# Patient Record
Sex: Female | Born: 1980 | Race: Black or African American | Hispanic: No | Marital: Single | Smoking: Never smoker
Health system: Southern US, Community
[De-identification: ages and names within clinical notes are randomized; demographics above are authoritative.]

## PROBLEM LIST (undated history)

## (undated) DIAGNOSIS — I10 Essential (primary) hypertension: Secondary | ICD-10-CM

---

## 2003-09-05 ENCOUNTER — Emergency Department (HOSPITAL_COMMUNITY): Admission: EM | Admit: 2003-09-05 | Discharge: 2003-09-05 | Payer: Self-pay | Admitting: *Deleted

## 2004-05-08 ENCOUNTER — Ambulatory Visit: Payer: Self-pay | Admitting: Nurse Practitioner

## 2004-07-14 ENCOUNTER — Ambulatory Visit: Payer: Self-pay | Admitting: Family Medicine

## 2004-08-09 ENCOUNTER — Ambulatory Visit: Payer: Self-pay | Admitting: Nurse Practitioner

## 2004-08-10 ENCOUNTER — Ambulatory Visit: Payer: Self-pay | Admitting: Nurse Practitioner

## 2004-11-21 ENCOUNTER — Ambulatory Visit: Payer: Self-pay | Admitting: Nurse Practitioner

## 2004-12-13 ENCOUNTER — Ambulatory Visit: Payer: Self-pay | Admitting: Nurse Practitioner

## 2004-12-14 ENCOUNTER — Ambulatory Visit: Payer: Self-pay | Admitting: Nurse Practitioner

## 2005-02-01 ENCOUNTER — Ambulatory Visit: Payer: Self-pay | Admitting: Nurse Practitioner

## 2005-09-18 ENCOUNTER — Emergency Department (HOSPITAL_COMMUNITY): Admission: EM | Admit: 2005-09-18 | Discharge: 2005-09-18 | Payer: Self-pay | Admitting: Emergency Medicine

## 2006-05-12 ENCOUNTER — Emergency Department (HOSPITAL_COMMUNITY): Admission: EM | Admit: 2006-05-12 | Discharge: 2006-05-12 | Payer: Self-pay | Admitting: Emergency Medicine

## 2006-08-24 ENCOUNTER — Emergency Department (HOSPITAL_COMMUNITY): Admission: EM | Admit: 2006-08-24 | Discharge: 2006-08-24 | Payer: Self-pay | Admitting: Emergency Medicine

## 2007-03-05 ENCOUNTER — Emergency Department (HOSPITAL_COMMUNITY): Admission: EM | Admit: 2007-03-05 | Discharge: 2007-03-05 | Payer: Self-pay | Admitting: Emergency Medicine

## 2007-05-19 ENCOUNTER — Emergency Department (HOSPITAL_COMMUNITY): Admission: EM | Admit: 2007-05-19 | Discharge: 2007-05-19 | Payer: Self-pay | Admitting: *Deleted

## 2007-06-24 ENCOUNTER — Inpatient Hospital Stay (HOSPITAL_COMMUNITY): Admission: AD | Admit: 2007-06-24 | Discharge: 2007-06-24 | Payer: Self-pay | Admitting: Obstetrics & Gynecology

## 2007-08-06 ENCOUNTER — Emergency Department (HOSPITAL_COMMUNITY): Admission: EM | Admit: 2007-08-06 | Discharge: 2007-08-06 | Payer: Self-pay | Admitting: Emergency Medicine

## 2008-03-05 ENCOUNTER — Inpatient Hospital Stay (HOSPITAL_COMMUNITY): Admission: AD | Admit: 2008-03-05 | Discharge: 2008-03-05 | Payer: Self-pay | Admitting: Obstetrics & Gynecology

## 2008-08-21 ENCOUNTER — Inpatient Hospital Stay (HOSPITAL_COMMUNITY): Admission: AD | Admit: 2008-08-21 | Discharge: 2008-08-21 | Payer: Self-pay | Admitting: Gynecology

## 2008-10-07 ENCOUNTER — Inpatient Hospital Stay (HOSPITAL_COMMUNITY): Admission: AD | Admit: 2008-10-07 | Discharge: 2008-10-07 | Payer: Self-pay | Admitting: Obstetrics and Gynecology

## 2008-11-01 ENCOUNTER — Inpatient Hospital Stay (HOSPITAL_COMMUNITY): Admission: AD | Admit: 2008-11-01 | Discharge: 2008-11-01 | Payer: Self-pay | Admitting: Obstetrics and Gynecology

## 2008-12-03 ENCOUNTER — Inpatient Hospital Stay (HOSPITAL_COMMUNITY): Admission: AD | Admit: 2008-12-03 | Discharge: 2008-12-04 | Payer: Self-pay | Admitting: Obstetrics and Gynecology

## 2009-03-08 ENCOUNTER — Inpatient Hospital Stay (HOSPITAL_COMMUNITY): Admission: AD | Admit: 2009-03-08 | Discharge: 2009-03-08 | Payer: Self-pay | Admitting: Obstetrics and Gynecology

## 2009-04-02 ENCOUNTER — Inpatient Hospital Stay (HOSPITAL_COMMUNITY): Admission: AD | Admit: 2009-04-02 | Discharge: 2009-04-05 | Payer: Self-pay | Admitting: Obstetrics and Gynecology

## 2010-02-12 ENCOUNTER — Inpatient Hospital Stay (HOSPITAL_COMMUNITY): Admission: AD | Admit: 2010-02-12 | Discharge: 2010-02-12 | Payer: Self-pay | Admitting: Obstetrics and Gynecology

## 2010-02-12 ENCOUNTER — Ambulatory Visit: Payer: Self-pay | Admitting: Nurse Practitioner

## 2010-04-05 ENCOUNTER — Ambulatory Visit: Payer: Self-pay | Admitting: Gynecology

## 2010-04-05 ENCOUNTER — Inpatient Hospital Stay (HOSPITAL_COMMUNITY): Admission: AD | Admit: 2010-04-05 | Discharge: 2010-04-05 | Payer: Self-pay | Admitting: Obstetrics and Gynecology

## 2010-07-15 ENCOUNTER — Inpatient Hospital Stay (HOSPITAL_COMMUNITY)
Admission: AD | Admit: 2010-07-15 | Discharge: 2010-07-15 | Payer: Self-pay | Source: Home / Self Care | Attending: Obstetrics and Gynecology | Admitting: Obstetrics and Gynecology

## 2010-08-23 ENCOUNTER — Inpatient Hospital Stay (HOSPITAL_COMMUNITY)
Admission: AD | Admit: 2010-08-23 | Discharge: 2010-08-23 | Disposition: A | Discharge status: Home / Self Care | Payer: Medicaid Other | Source: Ambulatory Visit | Attending: Obstetrics and Gynecology | Admitting: Obstetrics and Gynecology

## 2010-08-23 DIAGNOSIS — O4700 False labor before 37 completed weeks of gestation, unspecified trimester: Secondary | ICD-10-CM | POA: Insufficient documentation

## 2010-09-12 ENCOUNTER — Inpatient Hospital Stay (HOSPITAL_COMMUNITY)
Admission: AD | Admit: 2010-09-12 | Discharge: 2010-09-12 | Disposition: A | Discharge status: Home / Self Care | Payer: Medicaid Other | Source: Ambulatory Visit | Attending: Obstetrics and Gynecology | Admitting: Obstetrics and Gynecology

## 2010-09-12 DIAGNOSIS — O479 False labor, unspecified: Secondary | ICD-10-CM | POA: Insufficient documentation

## 2010-09-21 ENCOUNTER — Inpatient Hospital Stay (HOSPITAL_COMMUNITY)
Admission: RE | Admit: 2010-09-21 | Payer: Medicaid Other | Source: Ambulatory Visit | Admitting: Obstetrics and Gynecology

## 2010-09-21 ENCOUNTER — Inpatient Hospital Stay (HOSPITAL_COMMUNITY)
Admission: RE | Admit: 2010-09-21 | Discharge: 2010-09-23 | DRG: 775 | Disposition: A | Discharge status: Home / Self Care | Payer: Medicaid Other | Source: Ambulatory Visit | Attending: Obstetrics and Gynecology | Admitting: Obstetrics and Gynecology

## 2010-09-21 LAB — CBC
MCH: 29.8 pg (ref 26.0–34.0)
Platelets: 265 10*3/uL (ref 150–400)
RBC: 3.92 MIL/uL (ref 3.87–5.11)

## 2010-09-21 LAB — RPR: RPR Ser Ql: NONREACTIVE

## 2010-09-22 ENCOUNTER — Inpatient Hospital Stay (HOSPITAL_COMMUNITY): Admission: AD | Admit: 2010-09-22 | Payer: Self-pay | Source: Home / Self Care | Admitting: Obstetrics and Gynecology

## 2010-09-22 LAB — CBC
Hemoglobin: 10.6 g/dL — ABNORMAL LOW (ref 12.0–15.0)
MCHC: 33.9 g/dL (ref 30.0–36.0)
Platelets: 225 10*3/uL (ref 150–400)
RDW: 13.2 % (ref 11.5–15.5)

## 2010-09-25 LAB — URINALYSIS, ROUTINE W REFLEX MICROSCOPIC
Glucose, UA: NEGATIVE mg/dL
Nitrite: NEGATIVE
Protein, ur: NEGATIVE mg/dL
Urobilinogen, UA: 0.2 mg/dL (ref 0.0–1.0)

## 2010-09-25 LAB — KLEIHAUER-BETKE STAIN: Fetal Cells %: 0 %

## 2010-09-28 LAB — COMPREHENSIVE METABOLIC PANEL
ALT: 10 U/L (ref 0–35)
Albumin: 3.2 g/dL — ABNORMAL LOW (ref 3.5–5.2)
Alkaline Phosphatase: 40 U/L (ref 39–117)
BUN: 4 mg/dL — ABNORMAL LOW (ref 6–23)
Chloride: 106 mEq/L (ref 96–112)
Glucose, Bld: 81 mg/dL (ref 70–99)
Potassium: 3.6 mEq/L (ref 3.5–5.1)
Sodium: 135 mEq/L (ref 135–145)
Total Bilirubin: 0.4 mg/dL (ref 0.3–1.2)

## 2010-09-28 LAB — CBC
HCT: 35.7 % — ABNORMAL LOW (ref 36.0–46.0)
MCV: 89.8 fL (ref 78.0–100.0)
RBC: 3.98 MIL/uL (ref 3.87–5.11)
WBC: 6.9 10*3/uL (ref 4.0–10.5)

## 2010-09-28 LAB — URINALYSIS, ROUTINE W REFLEX MICROSCOPIC
Bilirubin Urine: NEGATIVE
Ketones, ur: NEGATIVE mg/dL
Protein, ur: NEGATIVE mg/dL
Urobilinogen, UA: 0.2 mg/dL (ref 0.0–1.0)

## 2010-09-28 LAB — URINE MICROSCOPIC-ADD ON

## 2010-09-30 LAB — URINALYSIS, ROUTINE W REFLEX MICROSCOPIC
Glucose, UA: NEGATIVE mg/dL
Ketones, ur: NEGATIVE mg/dL
Protein, ur: NEGATIVE mg/dL
Urobilinogen, UA: 0.2 mg/dL (ref 0.0–1.0)

## 2010-09-30 LAB — CBC
HCT: 39.7 % (ref 36.0–46.0)
Hemoglobin: 13.6 g/dL (ref 12.0–15.0)
MCHC: 34.3 g/dL (ref 30.0–36.0)
RBC: 4.43 MIL/uL (ref 3.87–5.11)
WBC: 7.7 10*3/uL (ref 4.0–10.5)

## 2010-09-30 LAB — URINE CULTURE: Colony Count: 65000

## 2010-09-30 LAB — WET PREP, GENITAL
Trich, Wet Prep: NONE SEEN
Yeast Wet Prep HPF POC: NONE SEEN

## 2010-09-30 LAB — GC/CHLAMYDIA PROBE AMP, GENITAL
Chlamydia, DNA Probe: NEGATIVE
GC Probe Amp, Genital: NEGATIVE

## 2010-09-30 LAB — URINE MICROSCOPIC-ADD ON

## 2010-09-30 LAB — POCT PREGNANCY, URINE: Preg Test, Ur: POSITIVE

## 2010-09-30 LAB — HCG, QUANTITATIVE, PREGNANCY: hCG, Beta Chain, Quant, S: 142000 m[IU]/mL — ABNORMAL HIGH (ref <5)

## 2010-10-20 LAB — CBC
HCT: 31.2 % — ABNORMAL LOW (ref 36.0–46.0)
Hemoglobin: 10.8 g/dL — ABNORMAL LOW (ref 12.0–15.0)
Hemoglobin: 12.4 g/dL (ref 12.0–15.0)
Platelets: 222 10*3/uL (ref 150–400)
RBC: 3.34 MIL/uL — ABNORMAL LOW (ref 3.87–5.11)
RBC: 3.94 MIL/uL (ref 3.87–5.11)
WBC: 14.1 10*3/uL — ABNORMAL HIGH (ref 4.0–10.5)
WBC: 7.5 10*3/uL (ref 4.0–10.5)

## 2010-10-20 LAB — PLATELET COUNT: Platelets: 242 10*3/uL (ref 150–400)

## 2010-10-21 LAB — URINALYSIS, ROUTINE W REFLEX MICROSCOPIC
Bilirubin Urine: NEGATIVE
Glucose, UA: NEGATIVE mg/dL
Hgb urine dipstick: NEGATIVE
Nitrite: NEGATIVE
Specific Gravity, Urine: 1.02 (ref 1.005–1.030)
pH: 6 (ref 5.0–8.0)

## 2010-10-24 LAB — WET PREP, GENITAL: Trich, Wet Prep: NONE SEEN

## 2010-10-24 LAB — GC/CHLAMYDIA PROBE AMP, GENITAL: Chlamydia, DNA Probe: NEGATIVE

## 2010-10-25 LAB — URINALYSIS, ROUTINE W REFLEX MICROSCOPIC
Bilirubin Urine: NEGATIVE
Ketones, ur: NEGATIVE mg/dL
Nitrite: NEGATIVE
pH: 6 (ref 5.0–8.0)

## 2010-10-26 LAB — WET PREP, GENITAL: Yeast Wet Prep HPF POC: NONE SEEN

## 2010-10-31 LAB — CBC
Hemoglobin: 13.7 g/dL (ref 12.0–15.0)
MCHC: 33.9 g/dL (ref 30.0–36.0)
MCV: 87.9 fL (ref 78.0–100.0)
RBC: 4.6 MIL/uL (ref 3.87–5.11)

## 2010-10-31 LAB — URINALYSIS, ROUTINE W REFLEX MICROSCOPIC
Bilirubin Urine: NEGATIVE
Ketones, ur: NEGATIVE mg/dL
Nitrite: NEGATIVE
Protein, ur: NEGATIVE mg/dL
Urobilinogen, UA: 0.2 mg/dL (ref 0.0–1.0)

## 2010-10-31 LAB — POCT PREGNANCY, URINE: Preg Test, Ur: POSITIVE

## 2010-12-02 ENCOUNTER — Emergency Department (HOSPITAL_COMMUNITY)
Admission: EM | Admit: 2010-12-02 | Discharge: 2010-12-02 | Disposition: A | Discharge status: Home / Self Care | Payer: Medicaid Other | Attending: Emergency Medicine | Admitting: Emergency Medicine

## 2010-12-02 DIAGNOSIS — IMO0002 Reserved for concepts with insufficient information to code with codable children: Secondary | ICD-10-CM | POA: Insufficient documentation

## 2010-12-02 DIAGNOSIS — L299 Pruritus, unspecified: Secondary | ICD-10-CM | POA: Insufficient documentation

## 2010-12-02 DIAGNOSIS — N61 Mastitis without abscess: Secondary | ICD-10-CM | POA: Insufficient documentation

## 2011-04-06 LAB — GC/CHLAMYDIA PROBE AMP, GENITAL
Chlamydia, DNA Probe: NEGATIVE
GC Probe Amp, Genital: NEGATIVE

## 2011-04-06 LAB — WET PREP, GENITAL
Trich, Wet Prep: NONE SEEN
Yeast Wet Prep HPF POC: NONE SEEN

## 2011-04-06 LAB — POCT PREGNANCY, URINE
Operator id: 198171
Preg Test, Ur: NEGATIVE

## 2011-04-23 LAB — POCT PREGNANCY, URINE: Preg Test, Ur: NEGATIVE

## 2011-04-24 LAB — URINE MICROSCOPIC-ADD ON

## 2011-04-24 LAB — URINALYSIS, ROUTINE W REFLEX MICROSCOPIC
Glucose, UA: NEGATIVE
Leukocytes, UA: NEGATIVE
Nitrite: NEGATIVE
Protein, ur: NEGATIVE
pH: 5.5

## 2011-04-24 LAB — PREGNANCY, URINE: Preg Test, Ur: NEGATIVE

## 2011-04-27 LAB — URINALYSIS, ROUTINE W REFLEX MICROSCOPIC
Ketones, ur: NEGATIVE
Protein, ur: NEGATIVE
Urobilinogen, UA: 0.2

## 2011-04-27 LAB — URINE MICROSCOPIC-ADD ON

## 2011-04-27 LAB — BASIC METABOLIC PANEL
CO2: 25
Calcium: 9.6
Glucose, Bld: 86
Sodium: 139

## 2012-02-19 ENCOUNTER — Encounter (HOSPITAL_COMMUNITY): Payer: Self-pay | Admitting: Emergency Medicine

## 2012-02-19 ENCOUNTER — Emergency Department (HOSPITAL_COMMUNITY)
Admission: EM | Admit: 2012-02-19 | Discharge: 2012-02-19 | Disposition: A | Discharge status: Home / Self Care | Payer: Medicaid Other | Attending: Emergency Medicine | Admitting: Emergency Medicine

## 2012-02-19 DIAGNOSIS — S335XXA Sprain of ligaments of lumbar spine, initial encounter: Secondary | ICD-10-CM | POA: Insufficient documentation

## 2012-02-19 DIAGNOSIS — X500XXA Overexertion from strenuous movement or load, initial encounter: Secondary | ICD-10-CM | POA: Insufficient documentation

## 2012-02-19 DIAGNOSIS — S39012A Strain of muscle, fascia and tendon of lower back, initial encounter: Secondary | ICD-10-CM

## 2012-02-19 LAB — URINALYSIS, ROUTINE W REFLEX MICROSCOPIC
Bilirubin Urine: NEGATIVE
Specific Gravity, Urine: 1.017 (ref 1.005–1.030)
Urobilinogen, UA: 0.2 mg/dL (ref 0.0–1.0)

## 2012-02-19 LAB — URINE MICROSCOPIC-ADD ON

## 2012-02-19 LAB — PREGNANCY, URINE: Preg Test, Ur: NEGATIVE

## 2012-02-19 MED ORDER — DIAZEPAM 5 MG PO TABS: 5.0000 mg | ORAL_TABLET | Freq: Three times a day (TID) | ORAL | Status: AC | PRN

## 2012-02-19 MED ORDER — IBUPROFEN 800 MG PO TABS
800.0000 mg | ORAL_TABLET | Freq: Once | ORAL | Status: AC
  Administered 2012-02-19: 800 mg via ORAL
  Filled 2012-02-19: qty 1

## 2012-02-19 MED ORDER — NAPROXEN 375 MG PO TABS: 375.0000 mg | ORAL_TABLET | Freq: Two times a day (BID) | ORAL | Status: DC

## 2012-02-19 NOTE — ED Notes (Signed)
Pt reports 3 day hx of low back pain. tx with hot showers. Denies nausea or fever

## 2012-02-19 NOTE — ED Provider Notes (Signed)
History     CSN: [id number]  Arrival date & time 02/19/12  1326   First MD Initiated Contact with Patient 02/19/12 1445      Chief Complaint  Patient presents with  . low back pain      denies injury or dysuria. 3 day hx of low back pain. did not medicate    (Consider location/radiation/quality/duration/timing/severity/associated sxs/prior treatment) HPI Comments: Ashley Li is a 31 y.o. Female who presents with complaint of lower back pain. Pain started when she woke up 2 days ago. Pain worsened with movement, leaning forward, certain positions. No pain radiating down her legs. No bowel or urinary incontinence or retention. No abdominal pain. Pt does have two little kids that she has been lifting. No other injuries. No fever, nausea, vomiting, dysuria. No prior back problems.     History reviewed. No pertinent past medical history.  History reviewed. No pertinent past surgical history.  History reviewed. No pertinent family history.  History  Substance Use Topics  . Smoking status: Never Smoker   . Smokeless tobacco: Not on file  . Alcohol Use: No    OB History    Grav Para Term Preterm Abortions TAB SAB Ect Mult Living                  Review of Systems  Constitutional: Negative for fever and chills.  Respiratory: Negative.   Cardiovascular: Negative.   Gastrointestinal: Negative.   Genitourinary: Positive for vaginal bleeding. Negative for dysuria, hematuria, flank pain, menstrual problem and pelvic pain.  Musculoskeletal: Positive for back pain. Negative for gait problem.  Skin: Negative.   Neurological: Negative for weakness and numbness.    Allergies  Review of patient's allergies indicates no known allergies.  Home Medications   Current Outpatient Rx  Name Route Sig Dispense Refill  . BC HEADACHE POWDER PO Oral Take 1 packet by mouth every 6 (six) hours as needed. For pain      BP 113/70  Pulse 86  Temp 98.1 F (36.7 C) (Oral)  Resp 18   SpO2 100%  LMP 02/19/2012  Physical Exam  Nursing note and vitals reviewed. Constitutional: She is oriented to person, place, and time. She appears well-developed and well-nourished. No distress.  HENT:  Head: Normocephalic.  Eyes: Conjunctivae are normal.  Cardiovascular: Normal rate and normal heart sounds.   Pulmonary/Chest: Effort normal and breath sounds normal. No respiratory distress. She has no wheezes. She has no rales.  Abdominal: Soft. Bowel sounds are normal. She exhibits no distension. There is no tenderness. There is no rebound.       No CVA tenderness  Musculoskeletal:       No midline back tenderness. Bilateral lower paraspinal tenderness. No pain with straight leg raise.   Neurological: She is alert and oriented to person, place, and time. No cranial nerve deficit. Coordination normal.       2+ patellar reflexes bilaterally,  equal bilateral LE strength  Skin: Skin is warm and dry.  Psychiatric: She has a normal mood and affect.    ED Course  Procedures (including critical care time)  Pt with lower back pain, my suspicion is that this is musculoskeletal. Her urine is positive for RBCs and hgb, she is on her period. No signs of a kidney stone based on story and exam. She has no neuro deficits. No red flags to suggest cauda equana. Will try muscle relaxants, NSAIDs, follow up as needed.   Results for orders placed during  the hospital encounter of 02/19/12  URINALYSIS, ROUTINE W REFLEX MICROSCOPIC      Component Value Range   Color, Urine YELLOW  YELLOW   APPearance CLOUDY (*) CLEAR   Specific Gravity, Urine 1.017  1.005 - 1.030   pH 6.0  5.0 - 8.0   Glucose, UA NEGATIVE  NEGATIVE mg/dL   Hgb urine dipstick LARGE (*) NEGATIVE   Bilirubin Urine NEGATIVE  NEGATIVE   Ketones, ur NEGATIVE  NEGATIVE mg/dL   Protein, ur NEGATIVE  NEGATIVE mg/dL   Urobilinogen, UA 0.2  0.0 - 1.0 mg/dL   Nitrite NEGATIVE  NEGATIVE   Leukocytes, UA NEGATIVE  NEGATIVE  URINE  MICROSCOPIC-ADD ON      Component Value Range   Squamous Epithelial / LPF FEW (*) RARE   WBC, UA 0-2  <3 WBC/hpf   RBC / HPF 21-50  <3 RBC/hpf  PREGNANCY, URINE      Component Value Range   Preg Test, Ur NEGATIVE  NEGATIVE   No results found.    1. Lumbar strain       MDM          Lottie Mussel, PA 02/21/12 [phone]

## 2012-02-21 NOTE — ED Provider Notes (Signed)
Medical screening examination/treatment/procedure(s) were performed by non-physician practitioner and as supervising physician I was immediately available for consultation/collaboration.  Toy Baker, MD 02/21/12 [phone]

## 2012-07-15 ENCOUNTER — Emergency Department (HOSPITAL_COMMUNITY)
Admission: EM | Admit: 2012-07-15 | Discharge: 2012-07-15 | Disposition: A | Discharge status: Home / Self Care | Payer: Self-pay | Attending: Emergency Medicine | Admitting: Emergency Medicine

## 2012-07-15 ENCOUNTER — Encounter (HOSPITAL_COMMUNITY): Payer: Self-pay | Admitting: *Deleted

## 2012-07-15 DIAGNOSIS — R112 Nausea with vomiting, unspecified: Secondary | ICD-10-CM | POA: Insufficient documentation

## 2012-07-15 DIAGNOSIS — K2970 Gastritis, unspecified, without bleeding: Secondary | ICD-10-CM

## 2012-07-15 DIAGNOSIS — A088 Other specified intestinal infections: Secondary | ICD-10-CM | POA: Insufficient documentation

## 2012-07-15 LAB — URINALYSIS, ROUTINE W REFLEX MICROSCOPIC
Glucose, UA: NEGATIVE mg/dL
Leukocytes, UA: NEGATIVE
Nitrite: NEGATIVE
Protein, ur: 30 mg/dL — AB
Urobilinogen, UA: 1 mg/dL (ref 0.0–1.0)

## 2012-07-15 LAB — CBC WITH DIFFERENTIAL/PLATELET
Basophils Absolute: 0 10*3/uL (ref 0.0–0.1)
Basophils Relative: 0 % (ref 0–1)
Lymphocytes Relative: 9 % — ABNORMAL LOW (ref 12–46)
Neutro Abs: 6.4 10*3/uL (ref 1.7–7.7)
Neutrophils Relative %: 81 % — ABNORMAL HIGH (ref 43–77)
Platelets: 334 10*3/uL (ref 150–400)
RDW: 12.7 % (ref 11.5–15.5)
WBC: 7.8 10*3/uL (ref 4.0–10.5)

## 2012-07-15 LAB — URINE MICROSCOPIC-ADD ON

## 2012-07-15 LAB — BASIC METABOLIC PANEL
CO2: 26 mEq/L (ref 19–32)
Chloride: 103 mEq/L (ref 96–112)
GFR calc Af Amer: >90 mL/min (ref >90)
Sodium: 138 mEq/L (ref 135–145)

## 2012-07-15 LAB — LIPASE, BLOOD: Lipase: 58 U/L (ref 11–59)

## 2012-07-15 MED ORDER — ONDANSETRON HCL 4 MG/2ML IJ SOLN
4.0000 mg | INTRAMUSCULAR | Status: AC
  Administered 2012-07-15: 4 mg via INTRAVENOUS
  Filled 2012-07-15: qty 2

## 2012-07-15 MED ORDER — KETOROLAC TROMETHAMINE 30 MG/ML IJ SOLN
30.0000 mg | Freq: Once | INTRAMUSCULAR | Status: AC
  Administered 2012-07-15: 30 mg via INTRAVENOUS
  Filled 2012-07-15: qty 1

## 2012-07-15 MED ORDER — ONDANSETRON HCL 4 MG PO TABS: 4.0000 mg | ORAL_TABLET | Freq: Four times a day (QID) | ORAL | Status: DC

## 2012-07-15 MED ORDER — SODIUM CHLORIDE 0.9 % IV BOLUS (SEPSIS)
1000.0000 mL | Freq: Once | INTRAVENOUS | Status: AC
  Administered 2012-07-15: 1000 mL via INTRAVENOUS

## 2012-07-15 NOTE — ED Provider Notes (Signed)
History     CSN: [id number]  Arrival date & time 07/15/12  7846   First MD Initiated Contact with Patient 07/15/12 1005      Chief Complaint  Patient presents with  . Abdominal Pain  . Emesis    (Consider location/radiation/quality/duration/timing/severity/associated sxs/prior treatment) Patient is a 31 y.o. female presenting with abdominal pain and vomiting. The history is provided by the patient, a significant other and medical records.  Abdominal Pain The primary symptoms of the illness include abdominal pain, nausea and vomiting. The primary symptoms of the illness do not include fever, fatigue, shortness of breath, diarrhea or dysuria.  Symptoms associated with the illness do not include chills, diaphoresis, urgency, hematuria or back pain.  Emesis  Associated symptoms include abdominal pain. Pertinent negatives include no chills, no cough, no diarrhea, no fever and no headaches.    Ashley Li is a 31 y.o. female  with No medical hx presents to the Emergency Department complaining of gradual,intermittent, stabilized, mild, generalized abdominal pain onset 12 hours ago. Pt states the pain and vomiting came together and have been associated.  Associated symptoms include nausea and vomiting. She is unable to describe the pain, rated at an 8/10 and directly associated with the vomiting episodes. Nothing makes it better and nothing makes it worse.  Pt denies fever, chills, headache, neck pain, chest pain, shortness of breath, diarrhea, weakness, dizziness, syncope.       History reviewed. No pertinent past medical history.  History reviewed. No pertinent past surgical history.  History reviewed. No pertinent family history.  History  Substance Use Topics  . Smoking status: Never Smoker   . Smokeless tobacco: Not on file  . Alcohol Use: No    OB History    Grav Para Term Preterm Abortions TAB SAB Ect Mult Living                  Review of Systems  Constitutional:  Positive for appetite change. Negative for fever, chills, diaphoresis, activity change and fatigue.  HENT: Negative for congestion, sore throat, rhinorrhea, sneezing, trouble swallowing, neck pain, neck stiffness and sinus pressure.   Eyes: Negative for photophobia and visual disturbance.  Respiratory: Negative for cough, chest tightness, shortness of breath and wheezing.   Cardiovascular: Negative for chest pain.  Gastrointestinal: Positive for nausea, vomiting and abdominal pain. Negative for diarrhea.  Genitourinary: Negative for dysuria, urgency, hematuria and decreased urine volume.  Musculoskeletal: Negative for back pain.  Skin: Negative for rash.  Neurological: Negative for syncope, weakness, light-headedness and headaches.  Hematological: Does not bruise/bleed easily.  Psychiatric/Behavioral: The patient is not nervous/anxious.   All other systems reviewed and are negative.    Allergies  Review of patient's allergies indicates no known allergies.  Home Medications   Current Outpatient Rx  Name  Route  Sig  Dispense  Refill  . ONDANSETRON HCL 4 MG PO TABS   Oral   Take 1 tablet (4 mg total) by mouth every 6 (six) hours.   12 tablet   0     BP 108/55  Pulse 78  Temp 98.6 F (37 C) (Oral)  Resp 16  SpO2 100%  Physical Exam  Nursing note and vitals reviewed. Constitutional: She is oriented to person, place, and time. She appears well-developed and well-nourished.  HENT:  Head: Normocephalic and atraumatic.  Right Ear: Tympanic membrane, external ear and ear canal normal.  Left Ear: Tympanic membrane, external ear and ear canal normal.  Nose: Nose normal.  Right sinus exhibits no maxillary sinus tenderness and no frontal sinus tenderness. Left sinus exhibits no maxillary sinus tenderness and no frontal sinus tenderness.  Mouth/Throat: Uvula is midline, oropharynx is clear and moist and mucous membranes are normal.  Eyes: Conjunctivae normal are normal. Pupils are  equal, round, and reactive to light. No scleral icterus.  Neck: Normal range of motion.  Cardiovascular: Regular rhythm, S1 normal, S2 normal, normal heart sounds and intact distal pulses.  Tachycardia present.  Exam reveals no gallop and no friction rub.   No murmur heard. Pulses:      Radial pulses are 2+ on the right side, and 2+ on the left side.       Dorsalis pedis pulses are 2+ on the right side, and 2+ on the left side.       Posterior tibial pulses are 2+ on the right side, and 2+ on the left side.  Pulmonary/Chest: Effort normal and breath sounds normal. No respiratory distress. She has no decreased breath sounds. She has no wheezes. She has no rhonchi. She has no rales.  Abdominal: Soft. Normal appearance and bowel sounds are normal. She exhibits no distension and no mass. There is no hepatosplenomegaly. There is tenderness ( generalized, mild). There is no rigidity, no rebound, no guarding, no CVA tenderness, no tenderness at McBurney's point and negative Murphy's sign.  Musculoskeletal: Normal range of motion.  Lymphadenopathy:    She has no cervical adenopathy.  Neurological: She is alert and oriented to person, place, and time. She exhibits normal muscle tone. Coordination normal.  Skin: Skin is warm and dry. No rash noted.  Psychiatric: She has a normal mood and affect.    ED Course  Procedures (including critical care time)  Labs Reviewed  URINALYSIS, ROUTINE W REFLEX MICROSCOPIC - Abnormal; Notable for the following:    APPearance CLOUDY (*)     pH 8.5 (*)     Protein, ur 30 (*)     All other components within normal limits  CBC WITH DIFFERENTIAL - Abnormal; Notable for the following:    Neutrophils Relative 81 (*)     Lymphocytes Relative 9 (*)     All other components within normal limits  BASIC METABOLIC PANEL - Abnormal; Notable for the following:    Glucose, Bld 102 (*)     All other components within normal limits  URINE MICROSCOPIC-ADD ON - Abnormal; Notable  for the following:    Squamous Epithelial / LPF MANY (*)     Bacteria, UA FEW (*)     All other components within normal limits  LIPASE, BLOOD   No results found.   1. Viral gastritis       MDM  Ashley Li presents with epigastric pain, N/V.  Patient with symptoms consistent with viral gastritis.  Vitals are stable, no fever.  Patient is nontoxic, nonseptic appearing, in no apparent distress.  Patient does not meet the SIRS or Sepsis criteria.  Pt's symptoms have been managed in the department; fluid bolus given.  No signs of dehydration, tolerating PO fluids > 6 oz.  Lungs are clear.  No focal abdominal pain, no peritoneal signs, no concern for appendicitis, cholecystitis, pancreatitis, ruptured viscus, UTI, kidney stone, PID, ectopic pregnancy or any other abdominal etiology.  Supportive therapy indicated with return if symptoms worsen.  Patient counseled.  I have also discussed reasons to return immediately to the ER.  Patient expresses understanding and agrees with plan.  1. Medications: Zofran, usual home medications  2. Treatment: rest, drink plenty of fluids, take medications as prescribed, BRAT diet 3. Follow Up: Please followup with your primary doctor for discussion of your diagnoses and further evaluation after today's visit; if you do not have a primary care doctor use the resource guide provided to find one;        Dierdre Forth, PA-C 07/15/12 1302

## 2012-07-15 NOTE — ED Notes (Signed)
Pt changing into gown

## 2012-07-15 NOTE — ED Provider Notes (Signed)
Medical screening examination/treatment/procedure(s) were performed by non-physician practitioner and as supervising physician I was immediately available for consultation/collaboration.    Kimbra Marcelino L Chilton Sallade, MD 07/15/12 1307 

## 2012-07-15 NOTE — ED Notes (Signed)
Pt reports abdominal pain 9/10 started 0130. Pt has vomited x9 times since then. Last bowel movement this am.

## 2012-08-07 ENCOUNTER — Emergency Department (HOSPITAL_COMMUNITY): Payer: Self-pay

## 2012-08-07 ENCOUNTER — Encounter (HOSPITAL_COMMUNITY): Payer: Self-pay | Admitting: *Deleted

## 2012-08-07 ENCOUNTER — Emergency Department (HOSPITAL_COMMUNITY)
Admission: EM | Admit: 2012-08-07 | Discharge: 2012-08-07 | Disposition: A | Discharge status: Home / Self Care | Payer: Self-pay | Attending: Emergency Medicine | Admitting: Emergency Medicine

## 2012-08-07 DIAGNOSIS — M79605 Pain in left leg: Secondary | ICD-10-CM

## 2012-08-07 DIAGNOSIS — Y929 Unspecified place or not applicable: Secondary | ICD-10-CM | POA: Insufficient documentation

## 2012-08-07 DIAGNOSIS — IMO0002 Reserved for concepts with insufficient information to code with codable children: Secondary | ICD-10-CM | POA: Insufficient documentation

## 2012-08-07 DIAGNOSIS — W010XXA Fall on same level from slipping, tripping and stumbling without subsequent striking against object, initial encounter: Secondary | ICD-10-CM | POA: Insufficient documentation

## 2012-08-07 DIAGNOSIS — S99929A Unspecified injury of unspecified foot, initial encounter: Secondary | ICD-10-CM | POA: Insufficient documentation

## 2012-08-07 DIAGNOSIS — S8990XA Unspecified injury of unspecified lower leg, initial encounter: Secondary | ICD-10-CM | POA: Insufficient documentation

## 2012-08-07 DIAGNOSIS — S76019A Strain of muscle, fascia and tendon of unspecified hip, initial encounter: Secondary | ICD-10-CM

## 2012-08-07 DIAGNOSIS — Z79899 Other long term (current) drug therapy: Secondary | ICD-10-CM | POA: Insufficient documentation

## 2012-08-07 DIAGNOSIS — Y939 Activity, unspecified: Secondary | ICD-10-CM | POA: Insufficient documentation

## 2012-08-07 MED ORDER — HYDROCODONE-ACETAMINOPHEN 5-325 MG PO TABS: 1.0000 | ORAL_TABLET | ORAL | Status: DC | PRN

## 2012-08-07 MED ORDER — DIAZEPAM 5 MG PO TABS: 5.0000 mg | ORAL_TABLET | Freq: Three times a day (TID) | ORAL | Status: DC | PRN

## 2012-08-07 NOTE — ED Provider Notes (Signed)
History     CSN: [id number]  Arrival date & time 08/07/12  3086   First MD Initiated Contact with Patient 08/07/12 0315      Chief Complaint  Patient presents with  . Leg Pain    (Consider location/radiation/quality/duration/timing/severity/associated sxs/prior treatment) HPI 31 yo female presents to the ER from home with complaint of left leg pain.  Pt reports she slipped and fell about 3 weeks ago, unsure what caused the fall or how she landed when she fell.  Pt c/o pain from her left hip down into her foot.  No focal weakness, numbness.  Pt c/o pain severe at time causing leg to buckle.  Pain worse when lying down, better when up and walking.  No numbness to buttocks, no urinary or bowel dysfunction.  Pt has been taking naprosyn without improvement.  History reviewed. No pertinent past medical history.  History reviewed. No pertinent past surgical history.  No family history on file.  History  Substance Use Topics  . Smoking status: Never Smoker   . Smokeless tobacco: Not on file  . Alcohol Use: No    OB History    Grav Para Term Preterm Abortions TAB SAB Ect Mult Living                  Review of Systems  All other systems reviewed and are negative.    Allergies  Review of patient's allergies indicates no known allergies.  Home Medications   Current Outpatient Rx  Name  Route  Sig  Dispense  Refill  . NAPROXEN SODIUM 220 MG PO TABS   Oral   Take 220 mg by mouth 2 (two) times daily with a meal.         . ONDANSETRON HCL 4 MG PO TABS   Oral   Take 1 tablet (4 mg total) by mouth every 6 (six) hours.   12 tablet   0   . DIAZEPAM 5 MG PO TABS   Oral   Take 1 tablet (5 mg total) by mouth every 8 (eight) hours as needed (muscle spasm).   15 tablet   0   . HYDROCODONE-ACETAMINOPHEN 5-325 MG PO TABS   Oral   Take 1-2 tablets by mouth every 4 (four) hours as needed for pain.   15 tablet   0     BP 132/70  Pulse 86  Temp 98.3 F (36.8 C)  Resp  20  SpO2 100%  LMP 07/31/2012  Physical Exam  Nursing note and vitals reviewed. Constitutional: She is oriented to person, place, and time. She appears well-developed and well-nourished. No distress.       Sitting cross legged on the bed, moves easily without difficulty.  HENT:  Head: Normocephalic and atraumatic.  Musculoskeletal: She exhibits tenderness.       Pain with SLR on either side, pain in left groin/thigh.  No pain with palpation of lumbar, sacrum, SI joint, or buttock  Neurological: She is alert and oriented to person, place, and time. She displays normal reflexes. She exhibits normal muscle tone. Coordination normal.  Skin: Skin is warm and dry. No rash noted. She is not diaphoretic. No erythema. No pallor.    ED Course  Procedures (including critical care time)  Labs Reviewed - No data to display Dg Hip Complete Left  08/07/2012  *RADIOLOGY REPORT*  Clinical Data: Larey Seat 3 weeks ago; worsening left hip pain, radiating down leg.  LEFT HIP - COMPLETE 2+ VIEW  Comparison: None.  Findings: There is no evidence of fracture or dislocation.  Both femoral heads are seated normally within their respective acetabula.  The proximal left femur appears intact.  No significant degenerative change is appreciated.  The sacroiliac joints are unremarkable in appearance.  The visualized bowel gas pattern is grossly unremarkable in appearance.  IMPRESSION: No evidence of fracture or dislocation.   Original Report Authenticated By: Tonia Ghent, M.D.      1. Left leg pain   2. Hip strain       MDM  32 yo female with left leg pain after fall.  Xrays unremarkable.  Suspect hip/thigh strain.  Will continue NSAIDs, add valium, vicodin and f/u with pcm or ortho if symptoms continue.        Olivia Mackie, MD 08/07/12 [phone]

## 2012-08-07 NOTE — ED Notes (Signed)
Pt states she has taken Aleve and "generic arthritis" medicine for leg pain without relief.  Pt states "the pain is worse when walking or lying, and not as bad when I'm standing or walking"

## 2012-08-07 NOTE — ED Notes (Signed)
Pt fell 3 wks ago; continues to have left leg pain from hip to ankle;

## 2012-09-08 ENCOUNTER — Emergency Department (HOSPITAL_COMMUNITY)
Admission: EM | Admit: 2012-09-08 | Discharge: 2012-09-09 | Disposition: A | Discharge status: Home / Self Care | Payer: Self-pay | Attending: Emergency Medicine | Admitting: Emergency Medicine

## 2012-09-08 ENCOUNTER — Emergency Department (HOSPITAL_COMMUNITY): Payer: Self-pay

## 2012-09-08 DIAGNOSIS — X58XXXA Exposure to other specified factors, initial encounter: Secondary | ICD-10-CM | POA: Insufficient documentation

## 2012-09-08 DIAGNOSIS — S39012D Strain of muscle, fascia and tendon of lower back, subsequent encounter: Secondary | ICD-10-CM

## 2012-09-08 DIAGNOSIS — S335XXA Sprain of ligaments of lumbar spine, initial encounter: Secondary | ICD-10-CM | POA: Insufficient documentation

## 2012-09-08 DIAGNOSIS — Y929 Unspecified place or not applicable: Secondary | ICD-10-CM | POA: Insufficient documentation

## 2012-09-08 DIAGNOSIS — Y939 Activity, unspecified: Secondary | ICD-10-CM | POA: Insufficient documentation

## 2012-09-08 NOTE — ED Notes (Signed)
Pt states that she fell on New Years and was feeling better but her L leg and hip has started hurting again. States she was evaluated before and dx with a hamstring strain.

## 2012-09-08 NOTE — ED Provider Notes (Signed)
History     CSN: [id number]  Arrival date & time 09/08/12  2143   First MD Initiated Contact with Patient 09/08/12 2214      Chief Complaint  Patient presents with  . Leg Pain    (Consider location/radiation/quality/duration/timing/severity/associated sxs/prior treatment) HPI Patient is a 32 yo female who presents with left sided leg pain that goes from her hip down to the top of her left foot.  This comes and goes and is worsened when sitting or laying down and also walking.  The pain right now is localized to her left hip and left knee.  She ws seen back in January of this year for the same complaint and was prescribed pain medications and that did not help.  She tried [place] at home but that didn't work either.  She feels like the pain has become more intense and more frequent.  She is able to move her hip/knee and foot in all directions.     No past medical history on file.  No past surgical history on file.  No family history on file.  History  Substance Use Topics  . Smoking status: Never Smoker   . Smokeless tobacco: Not on file  . Alcohol Use: No    OB History   Grav Para Term Preterm Abortions TAB SAB Ect Mult Living                  Review of Systems All other systems negative except as documented in the HPI. All pertinent positives and negatives as reviewed in the HPI.  Allergies  Review of patient's allergies indicates no known allergies.  Home Medications   Current Outpatient Rx  Name  Route  Sig  Dispense  Refill  . acetaminophen (TYLENOL EX ST ARTHRITIS PAIN) 500 MG tablet   Oral   Take 500 mg by mouth every 6 (six) hours as needed for pain. Pain           BP 121/92  Pulse 99  Temp(Src) 98.6 F (37 C) (Oral)  Resp 20  Ht 5\' 4"  (1.626 m)  Wt 167 lb (75.751 kg)  BMI 28.65 kg/m2  SpO2 97%  LMP 08/25/2012  Physical Exam  Nursing note and vitals reviewed. Constitutional: She is oriented to person, place, and time. She appears  well-developed and well-nourished. No distress.  HENT:  Head: Normocephalic and atraumatic.  Right Ear: External ear normal.  Left Ear: External ear normal.  Eyes: Right eye exhibits no discharge. Left eye exhibits no discharge. No scleral icterus.  Neck: Normal range of motion.  Pulmonary/Chest: Effort normal.  Musculoskeletal: Normal range of motion.  No tenderness to palpation of the hip, knee and left foot   Neurological: She is alert and oriented to person, place, and time.  Skin: Skin is warm and dry. No rash noted. She is not diaphoretic. No erythema. No pallor.  Psychiatric: She has a normal mood and affect. Her behavior is normal. Judgment and thought content normal.    ED Course  Procedures (including critical care time)  The patient most likely has lumbar radiculopathy. The patient is stable here in the ER. She is advised to follow up with ortho.  MDM          Carlyle Dolly, PA-C 09/09/12 0000

## 2012-09-09 MED ORDER — PREDNISONE 50 MG PO TABS: 50.0000 mg | ORAL_TABLET | Freq: Every day | ORAL | Status: DC

## 2012-09-09 MED ORDER — CYCLOBENZAPRINE HCL 10 MG PO TABS: 10.0000 mg | ORAL_TABLET | Freq: Three times a day (TID) | ORAL | Status: DC | PRN

## 2012-09-09 MED ORDER — HYDROCODONE-ACETAMINOPHEN 5-325 MG PO TABS: 1.0000 | ORAL_TABLET | Freq: Four times a day (QID) | ORAL | Status: DC | PRN

## 2012-09-09 NOTE — ED Provider Notes (Signed)
Medical screening examination/treatment/procedure(s) were performed by non-physician practitioner and as supervising physician I was immediately available for consultation/collaboration.  Raeford Razor, MD 09/09/12 [phone]

## 2013-01-20 ENCOUNTER — Encounter (HOSPITAL_COMMUNITY): Payer: Self-pay

## 2013-01-20 ENCOUNTER — Emergency Department (HOSPITAL_COMMUNITY)
Admission: EM | Admit: 2013-01-20 | Discharge: 2013-01-20 | Disposition: A | Discharge status: Home / Self Care | Payer: Self-pay | Attending: Emergency Medicine | Admitting: Emergency Medicine

## 2013-01-20 DIAGNOSIS — IMO0002 Reserved for concepts with insufficient information to code with codable children: Secondary | ICD-10-CM | POA: Insufficient documentation

## 2013-01-20 DIAGNOSIS — R209 Unspecified disturbances of skin sensation: Secondary | ICD-10-CM | POA: Insufficient documentation

## 2013-01-20 DIAGNOSIS — IMO0001 Reserved for inherently not codable concepts without codable children: Secondary | ICD-10-CM | POA: Insufficient documentation

## 2013-01-20 DIAGNOSIS — M5417 Radiculopathy, lumbosacral region: Secondary | ICD-10-CM

## 2013-01-20 MED ORDER — GABAPENTIN 300 MG PO CAPS: 300.0000 mg | ORAL_CAPSULE | Freq: Three times a day (TID) | ORAL | Status: DC

## 2013-01-20 NOTE — ED Provider Notes (Signed)
History  This chart was scribed for non-physician practitioner working with Geoffery Lyons, MD by Greggory Stallion, ED scribe. This patient was seen in room WA01/WA01 and the patient's care was started at 3:37 PM.  CSN: [id number] Arrival date & time 01/20/13  1520  Chief Complaint  Patient presents with  . Numbness  . Leg Pain   The history is provided by the patient. No language interpreter was used.    HPI Comments: Ashley Li is a 32 y.o. female who presents to the Emergency Department complaining of gradually worsening, constant left leg pain and intermittent numbness that started in January. Pt has been here twice for same (08/07/12 and 09/08/12), both times given ortho follow ups, but per pt she has not yet followed up due to lack of insurance. Imaging was negative at the time.   Today's complaint involves a burning pain down the back of her left leg that goes to her knee and sometimes down to her foot. Pt denies new injury. Pt states the pain ranges from mild to severe. Today's pain is moderate, aching, and dull. Worse when she stands during her 12-hour work shifts.  Pt states she has taken ibuprofen with no relief.  Pt states she does not have a PCP.  History reviewed. No pertinent past medical history. History reviewed. No pertinent past surgical history. No family history on file. History  Substance Use Topics  . Smoking status: Never Smoker   . Smokeless tobacco: Never Used  . Alcohol Use: Yes     Comment: socially    OB History   Grav Para Term Preterm Abortions TAB SAB Ect Mult Living                 Review of Systems  Constitutional: Negative for diaphoresis.  HENT: Negative for neck stiffness.   Eyes: Negative for visual disturbance.  Respiratory: Negative for apnea, chest tightness and shortness of breath.   Cardiovascular: Negative for chest pain and palpitations.  Gastrointestinal: Negative for nausea, vomiting, diarrhea and constipation.  Genitourinary:  Negative for dysuria.  Musculoskeletal: Positive for myalgias. Negative for gait problem.  Skin: Negative for rash.  Neurological: Positive for numbness. Negative for dizziness, weakness, light-headedness and headaches.    Allergies  Review of patient's allergies indicates no known allergies.  Home Medications   Current Outpatient Rx  Name  Route  Sig  Dispense  Refill  . acetaminophen (TYLENOL EX ST ARTHRITIS PAIN) 500 MG tablet   Oral   Take 500 mg by mouth every 6 (six) hours as needed for pain. Pain          BP 142/73  Pulse 85  Temp(Src) 98.1 F (36.7 C) (Oral)  Resp 18  SpO2 100%  LMP 12/23/2012  Physical Exam  Nursing note and vitals reviewed. Constitutional: She is oriented to person, place, and time. She appears well-developed and well-nourished. No distress.  HENT:  Head: Normocephalic and atraumatic.  Eyes: Conjunctivae and EOM are normal. Pupils are equal, round, and reactive to light.  Neck: Normal range of motion. Neck supple.  No meningeal signs  Cardiovascular: Normal rate, regular rhythm and normal heart sounds.  Exam reveals no gallop and no friction rub.   No murmur heard. Pulmonary/Chest: Effort normal and breath sounds normal. No respiratory distress. She has no wheezes. She has no rales. She exhibits no tenderness.  Abdominal: Soft. Bowel sounds are normal. She exhibits no distension. There is no tenderness. There is no rebound and no guarding.  Musculoskeletal: Normal range of motion. She exhibits no edema and no tenderness.  5/5 strength throughout. Full active and passive range of motion of left lower extremity. No swelling. No erythema. No warmth. No effusion. Good quadricep strength on straight leg raise. No joint laxity.  Neurological: She is alert and oriented to person, place, and time. No cranial nerve deficit.  Speech is clear and goal oriented, follows commands Sensation normal to light touch and two point discrimination Moves extremities  without ataxia, coordination intact Normal gait and balance Normal strength in upper and lower extremities bilaterally including dorsiflexion and plantar flexion, strong and equal grip strength   Skin: Skin is warm and dry. She is not diaphoretic. No erythema.  Psychiatric: She has a normal mood and affect.    ED Course  Procedures (including critical care time)  DIAGNOSTIC STUDIES: Oxygen Saturation is 100% on RA, normal by my interpretation.    COORDINATION OF CARE: 3:50 PM-Discussed treatment plan which includes ibuprofen and pain medication with pt at bedside and pt agreed to plan. Advised pt to rest and use ice when the pain gets really bad. Advised pt to follow up with orthopedist.   Labs Reviewed - No data to display No results found. 1. Right lumbosacral radiculopathy     MDM  No neurological deficits and normal neuro exam.  Patient can walk but states is painful at times.  No loss of bowel or bladder control.  No fever, night sweats, weight loss, h/o cancer, IVDU.  RICE protocol and pain medicine indicated and discussed with patient. Considering HPI and PE, including lack of trauma, hx of chronic conditions, interventions that have worked in the past, no swelling, no erythema, no warmth, no effusion, no deformity, no bony tenderness, no fever or impaired ROM, not concerned for acute bony injury or septic arthritis. Agree with previous dx of likely radicular pain from original injury. Impressed upon pt the importance of following up with an ortho and developing a relationship with a PCP. Pt understood. Provided resource list and contact info for ortho. Discussed use of ibuprofen for pain management and will try a course of gabapentin to see if that relieves some of her nerve pain. Discussed reasons to seek immediate care. Patient expresses understanding and agrees with plan.  I personally performed the services described in this documentation, which was scribed in my presence. The  recorded information has been reviewed and is accurate.    Glade Nurse, PA-C 01/20/13 1621

## 2013-01-20 NOTE — ED Notes (Signed)
Pt presents with left leg pain and numbness that has been going on since January. Pt says the pain has gotten worse the last few weeks. Pt says the pain goes all the way from her foot to her hip. Ambulatory to room. Pt does report that she fell in January but the pain has gotten worse since then.

## 2013-01-20 NOTE — ED Provider Notes (Signed)
Medical screening examination/treatment/procedure(s) were performed by non-physician practitioner and as supervising physician I was immediately available for consultation/collaboration.  Geoffery Lyons, MD 01/20/13 1700

## 2013-01-20 NOTE — Progress Notes (Signed)
[facility] did not get to see patient but will be sending her information about the [employer] [facility], using the address provided.  °

## 2013-02-20 ENCOUNTER — Telehealth: Payer: Self-pay | Admitting: General Practice

## 2013-05-16 ENCOUNTER — Emergency Department (HOSPITAL_COMMUNITY)
Admission: EM | Admit: 2013-05-16 | Discharge: 2013-05-16 | Disposition: A | Discharge status: Home / Self Care | Payer: Self-pay | Attending: Emergency Medicine | Admitting: Emergency Medicine

## 2013-05-16 ENCOUNTER — Encounter (HOSPITAL_COMMUNITY): Payer: Self-pay | Admitting: Emergency Medicine

## 2013-05-16 DIAGNOSIS — Z791 Long term (current) use of non-steroidal anti-inflammatories (NSAID): Secondary | ICD-10-CM | POA: Insufficient documentation

## 2013-05-16 DIAGNOSIS — M25579 Pain in unspecified ankle and joints of unspecified foot: Secondary | ICD-10-CM | POA: Insufficient documentation

## 2013-05-16 DIAGNOSIS — M79605 Pain in left leg: Secondary | ICD-10-CM

## 2013-05-16 DIAGNOSIS — M5416 Radiculopathy, lumbar region: Secondary | ICD-10-CM

## 2013-05-16 DIAGNOSIS — IMO0002 Reserved for concepts with insufficient information to code with codable children: Secondary | ICD-10-CM | POA: Insufficient documentation

## 2013-05-16 DIAGNOSIS — M79609 Pain in unspecified limb: Secondary | ICD-10-CM | POA: Insufficient documentation

## 2013-05-16 DIAGNOSIS — M25559 Pain in unspecified hip: Secondary | ICD-10-CM | POA: Insufficient documentation

## 2013-05-16 DIAGNOSIS — Z79899 Other long term (current) drug therapy: Secondary | ICD-10-CM | POA: Insufficient documentation

## 2013-05-16 MED ORDER — KETOROLAC TROMETHAMINE 30 MG/ML IJ SOLN
30.0000 mg | Freq: Once | INTRAMUSCULAR | Status: AC
  Administered 2013-05-16: 30 mg via INTRAMUSCULAR
  Filled 2013-05-16: qty 1

## 2013-05-16 MED ORDER — MELOXICAM 7.5 MG PO TABS: 15.0000 mg | ORAL_TABLET | Freq: Every day | ORAL | Status: DC

## 2013-05-16 MED ORDER — TRAMADOL HCL 50 MG PO TABS: 50.0000 mg | ORAL_TABLET | Freq: Four times a day (QID) | ORAL | Status: DC | PRN

## 2013-05-16 NOTE — ED Notes (Signed)
Patient c/o L leg pain from hip to foot x2-3 days. Patient states she was at work and the pain and "swelling" increased. Patient states she is on her feet continuously at work. Patient reports no recent injuries, but does report a fall in January 2014.

## 2013-05-16 NOTE — ED Notes (Signed)
Patient wearing elastic knee support, patient removed it, ace wrap applied to L knee.

## 2013-05-16 NOTE — ED Provider Notes (Signed)
CSN: [id number]     Arrival date & time 05/16/13  2116 History  This chart was scribed for non-physician practitioner Antony Madura, PA-C, working with Raeford Razor, MD by Dorothey Baseman, ED Scribe. This patient was seen in room WTR6/WTR6 and the patient's care was started at 9:54 PM.    Chief Complaint  Patient presents with  . Leg Pain    Left, from hip to foot   Patient is a 32 y.o. female presenting with leg pain. The history is provided by the patient. No language interpreter was used.  Leg Pain Location:  Hip, leg, ankle and foot Injury: no   Hip location:  L hip Leg location:  L leg Ankle location:  L ankle Foot location:  L foot Pain details:    Quality:  Aching and throbbing   Severity:  Moderate   Timing:  Constant   Progression:  Worsening Chronicity:  Recurrent Relieved by:  Compression Worsened by:  Nothing tried Ineffective treatments:  NSAIDs Associated symptoms: no numbness and no tingling    HPI Comments: Ashley Li is a 32 y.o. female who presents to the Emergency Department complaining of a constant, aching, throbbing pain to the left leg that extends from the hip to the foot onset 2-3 days ago that has been progressively worsening. Patient has been seen here several times in the past for similar complaints and reports that the pain feels similar to the pain that she has experienced before. Patient reports that she has not been able to follow up with the referred orthopaedist due to lack of insurance. She states that she stands a lot for her work, but denies any recent or potential injury to the area. She denies any exacerbating symptoms and states that the pain has been mildly and temporarily relieved by applying pressure. She states that he has taken an Aleve at home without relief. She reports that she has been ambulatory with a limp. She denies any numbness, paresthesias, bowel or bladder incontinence. Patient denies history of cancer or IV drug use. She denies any  other pertinent medical history.   History reviewed. No pertinent past medical history. History reviewed. No pertinent past surgical history. History reviewed. No pertinent family history. History  Substance Use Topics  . Smoking status: Never Smoker   . Smokeless tobacco: Never Used  . Alcohol Use: Yes     Comment: socially    OB History   Grav Para Term Preterm Abortions TAB SAB Ect Mult Living                 Review of Systems  Musculoskeletal: Positive for arthralgias and myalgias.  Neurological: Negative for numbness.  All other systems reviewed and are negative.    Allergies  Review of patient's allergies indicates no known allergies.  Home Medications   Current Outpatient Rx  Name  Route  Sig  Dispense  Refill  . acetaminophen (TYLENOL EX ST ARTHRITIS PAIN) 500 MG tablet   Oral   Take 500 mg by mouth every 6 (six) hours as needed for pain. Pain         . etonogestrel (IMPLANON) 68 MG IMPL implant   Subcutaneous   Inject 1 each into the skin once.         . gabapentin (NEURONTIN) 300 MG capsule   Oral   Take 1 capsule (300 mg total) by mouth 3 (three) times daily.   42 capsule   0   . meloxicam (MOBIC) 7.5 MG  tablet   Oral   Take 2 tablets (15 mg total) by mouth daily.   30 tablet   0   . traMADol (ULTRAM) 50 MG tablet   Oral   Take 1 tablet (50 mg total) by mouth every 6 (six) hours as needed for pain.   11 tablet   0    Triage Vitals: BP 127/82  Pulse 87  Temp(Src) 97.8 F (36.6 C) (Oral)  Resp 16  SpO2 98%  LMP 04/21/2013  Physical Exam  Nursing note and vitals reviewed. Constitutional: She is oriented to person, place, and time. She appears well-developed and well-nourished. No distress.  HENT:  Head: Normocephalic and atraumatic.  Eyes: Conjunctivae and EOM are normal. No scleral icterus.  Neck: Normal range of motion.  Cardiovascular: Intact distal pulses.   Pulses:      Dorsalis pedis pulses are 2+ on the right side, and 2+  on the left side.       Posterior tibial pulses are 2+ on the right side, and 2+ on the left side.  Pulmonary/Chest: Effort normal. No respiratory distress.  Musculoskeletal: Normal range of motion.  No tenderness to palpation to the lumbosacral midline. Tenderness to palpation to the left lumbosacral paraspinal muscles. Full range of motion of the lower back with flexion and extention. Negative straight-leg raise and cross-leg raise.   Neurological: She is alert and oriented to person, place, and time. She has normal reflexes.  Ambulatory with normal gait. Distal sensation intact. DTRs normal and symmetric.   Skin: Skin is warm and dry. No rash noted. She is not diaphoretic. No erythema. No pallor.  Psychiatric: She has a normal mood and affect. Her behavior is normal.    ED Course  Procedures (including critical care time)  DIAGNOSTIC STUDIES: Oxygen Saturation is 98% on room air, normal by my interpretation.    COORDINATION OF CARE: 10:00 PM- Discussed that additional imaging will not be necessary at this time and that symptoms are likely related to nerves in the back. Advised patient to follow up with the referred orthopaedist to manage her chronic pain symptoms. Will discharge patient with an ACE wrap at the patient's request and Mobic and Tramadol to manage symptoms. Discussed treatment plan with patient at bedside and patient verbalized agreement.    Labs Review Labs Reviewed - No data to display Imaging Review No results found.  EKG Interpretation   None       MDM   1. Lumbar radiculopathy   2. Leg pain, left    Patient with hx of lumbar radiculopathy presents for onset of same. States pain feels similar to pain in past. Patient ambulatory and neurovascularly intact. Normal sensation, strength, and DTRs in b/l lower extremities. No red flags or signs concerning for cauda equina. No trauma or injury. No hx of CA or IVDU. Patient appropriate for d/c with orthopedic follow  up. Referral again provided as patient has failed to follow up regarding pain complaints. Mobic prescribed for pain and Tramadol prescribed for breakthrough pain. Return precautions discussed and patient agreeable to plan with no unaddressed concerns.  I personally performed the services described in this documentation, which was scribed in my presence. The recorded information has been reviewed and is accurate.     Antony Madura, PA-C 05/18/13 [phone]

## 2013-05-16 NOTE — ED Notes (Signed)
Patient observed sitting in chair with Left leg folded underneath her.

## 2013-05-21 NOTE — ED Provider Notes (Signed)
Medical screening examination/treatment/procedure(s) were performed by non-physician practitioner and as supervising physician I was immediately available for consultation/collaboration.  EKG Interpretation   None        Raeford Razor, MD 05/21/13 [phone]

## 2013-06-02 ENCOUNTER — Ambulatory Visit: Payer: Self-pay | Attending: Internal Medicine

## 2013-06-17 ENCOUNTER — Ambulatory Visit: Payer: Self-pay

## 2013-07-08 IMAGING — CR DG LUMBAR SPINE COMPLETE 4+V
5 series · 5 of 5 positions shown · non-contrast
Comparison: None.

CLINICAL DATA: Increasing back pain and right-sided pain radiating
down the right leg since a fall 2 months ago.

LUMBAR SPINE - COMPLETE 4+ VIEW

[t lumbar spine ap]
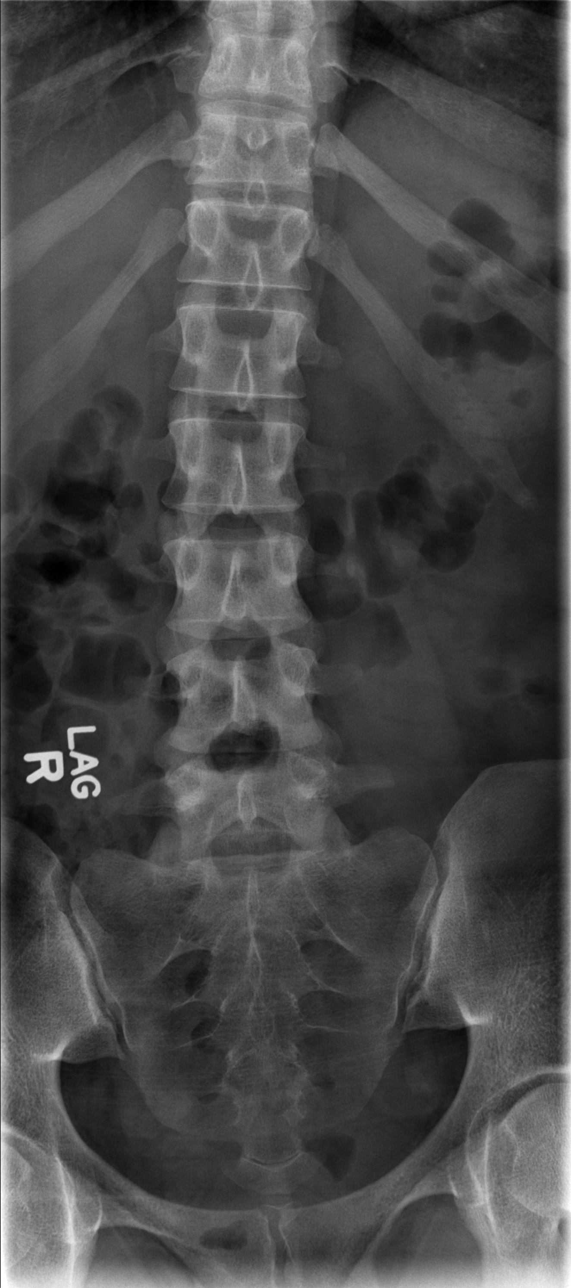

[t lumbar spine obl (1 of 2)]
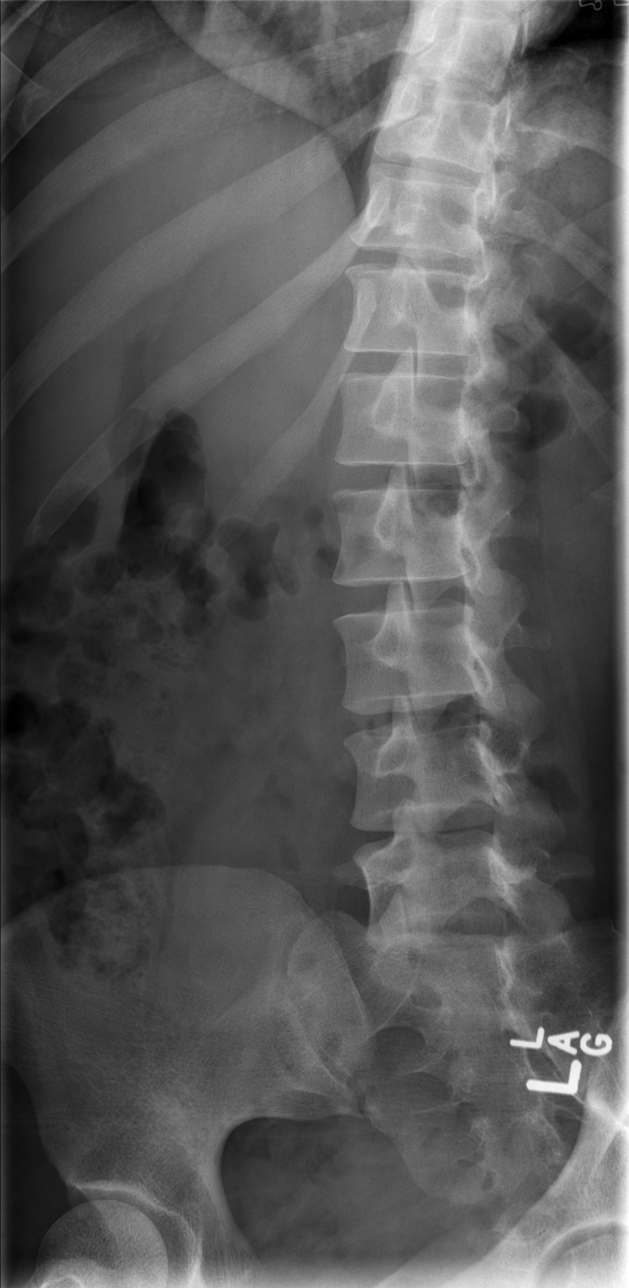

[t lumbar spine obl (2 of 2)]
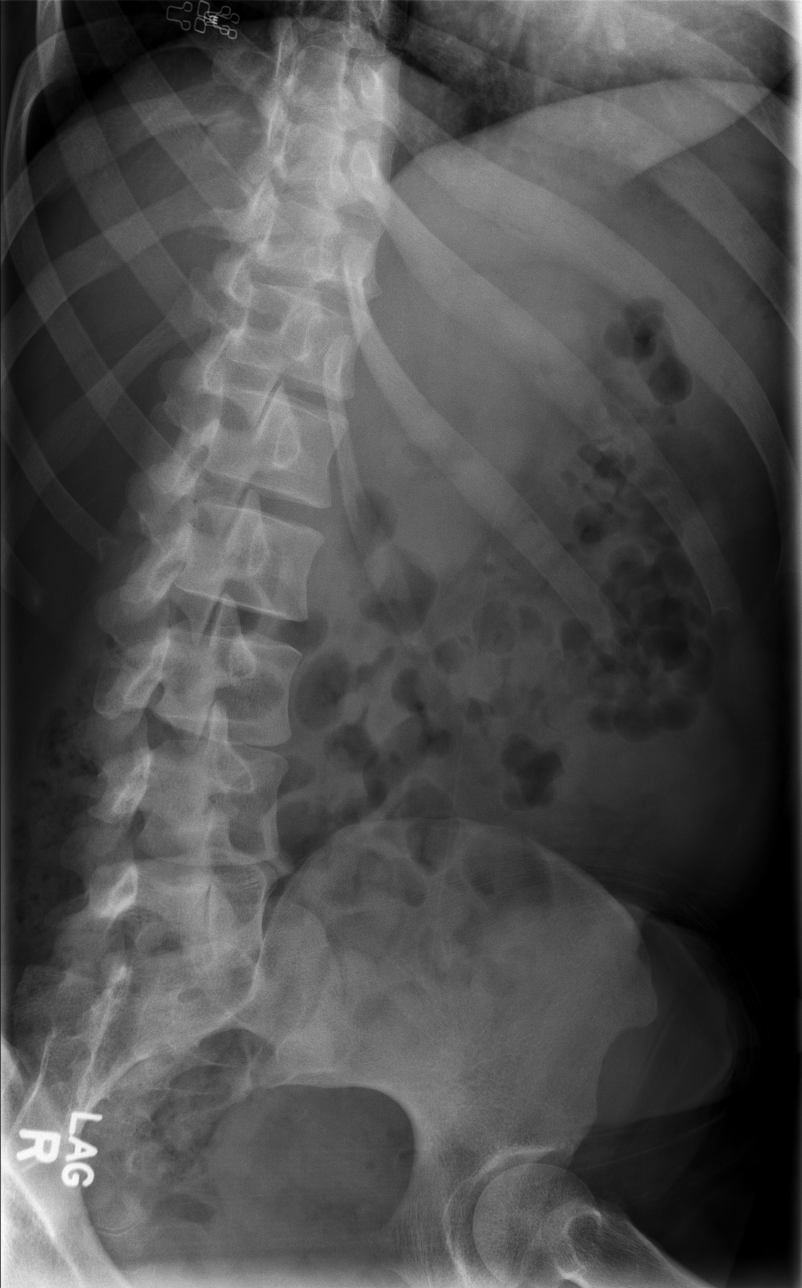

[t lumbar spine lat]
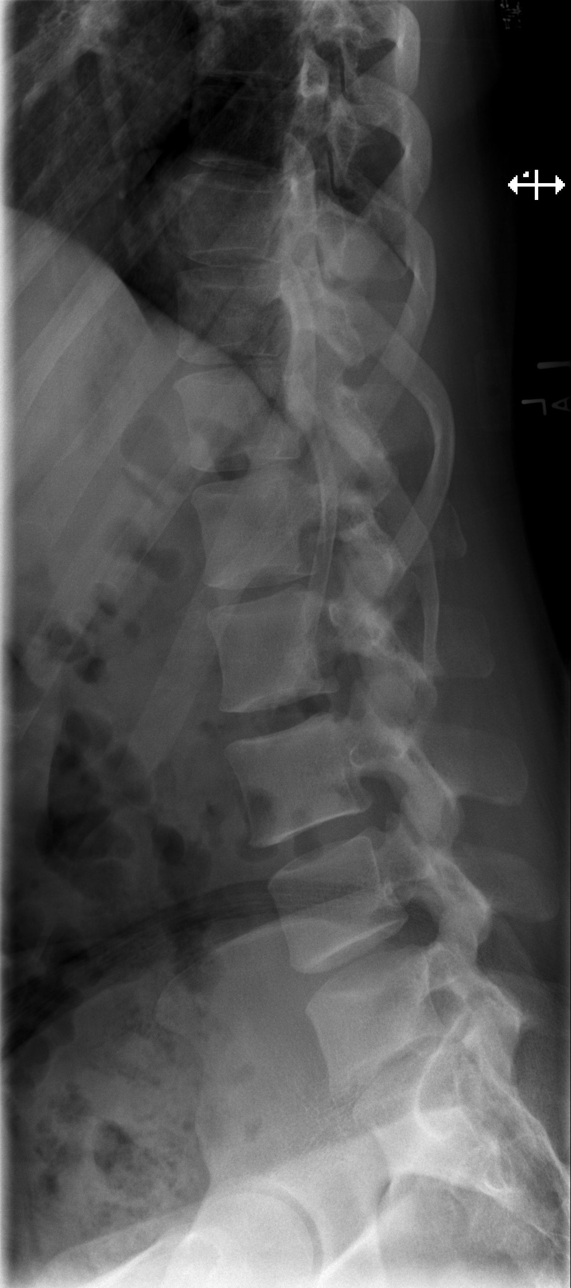

[t lumbar l-5 s-1 spot]
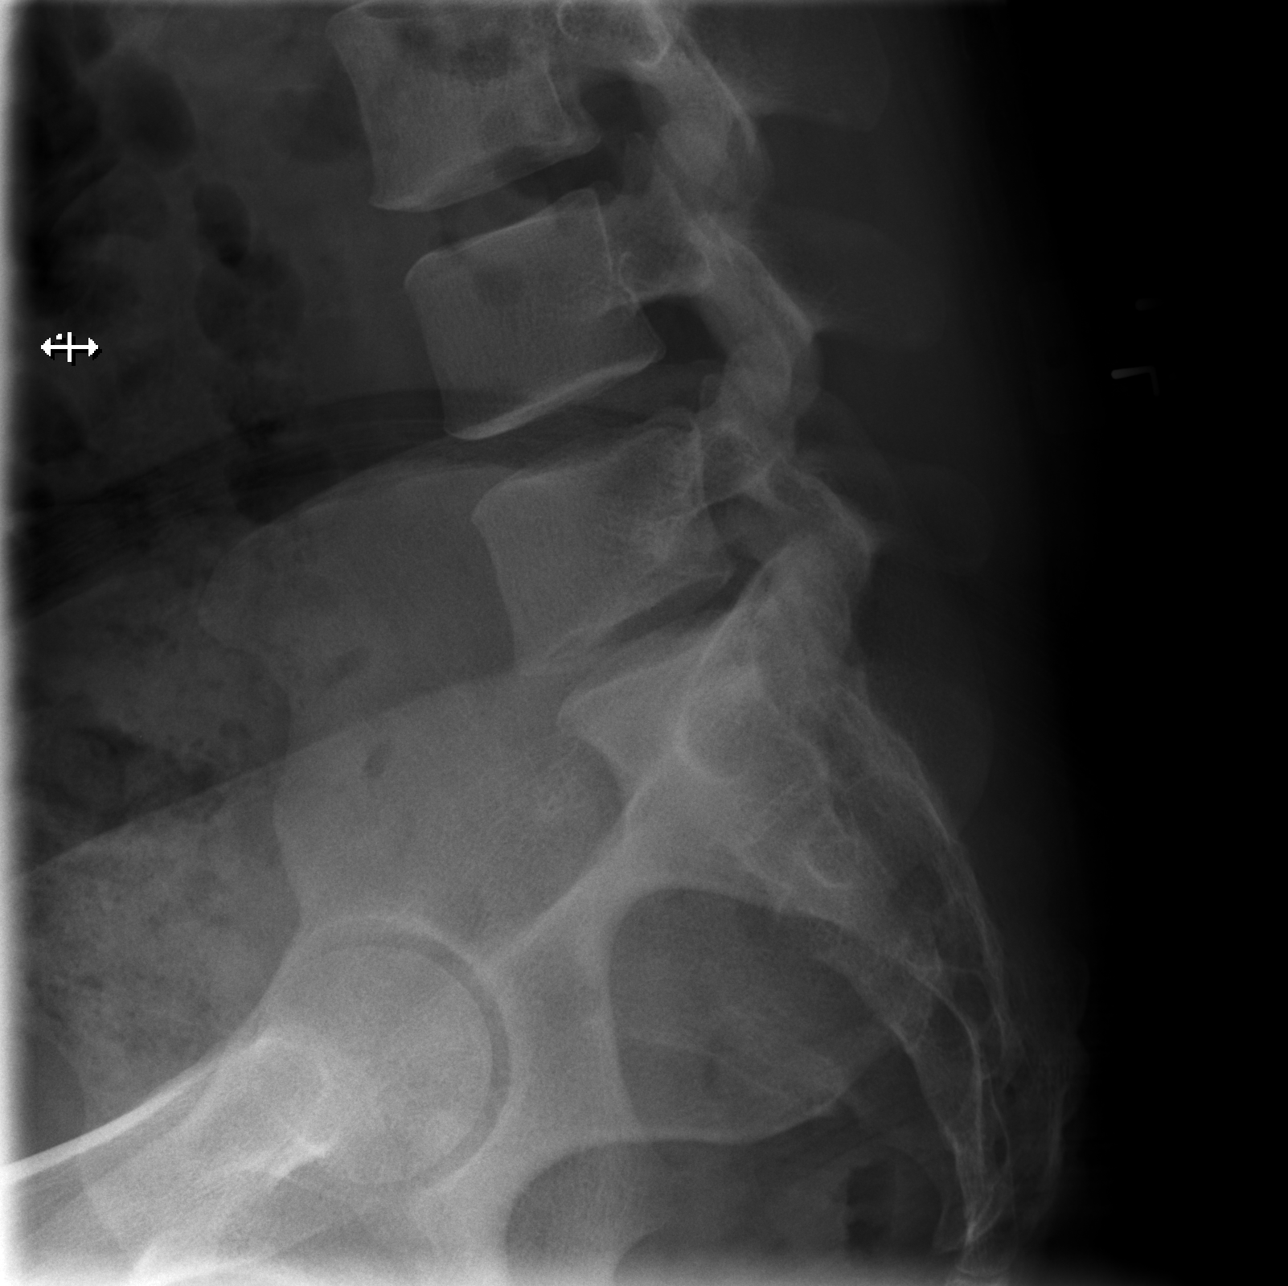

[5 of 5 positions shown; findings below may reference images not displayed]

FINDINGS: Five lumbar type vertebrae.  Normal alignment of the
lumbar vertebrae and facet joints.  No vertebral compression
deformities.  Intervertebral disc space heights are preserved.  No
focal bone lesion or bone destruction.  Bone cortex and trabecular
architecture appear intact.
IMPRESSION: No displaced lumbar spine fractures identified.

## 2013-08-25 ENCOUNTER — Encounter (HOSPITAL_COMMUNITY): Payer: Self-pay | Admitting: Emergency Medicine

## 2013-08-25 ENCOUNTER — Emergency Department (HOSPITAL_COMMUNITY)
Admission: EM | Admit: 2013-08-25 | Discharge: 2013-08-25 | Disposition: A | Discharge status: Home / Self Care | Payer: No Typology Code available for payment source | Attending: Emergency Medicine | Admitting: Emergency Medicine

## 2013-08-25 DIAGNOSIS — A088 Other specified intestinal infections: Secondary | ICD-10-CM | POA: Insufficient documentation

## 2013-08-25 DIAGNOSIS — M549 Dorsalgia, unspecified: Secondary | ICD-10-CM | POA: Insufficient documentation

## 2013-08-25 DIAGNOSIS — A084 Viral intestinal infection, unspecified: Secondary | ICD-10-CM

## 2013-08-25 DIAGNOSIS — Z3202 Encounter for pregnancy test, result negative: Secondary | ICD-10-CM | POA: Insufficient documentation

## 2013-08-25 LAB — URINALYSIS, ROUTINE W REFLEX MICROSCOPIC
Bilirubin Urine: NEGATIVE
Glucose, UA: NEGATIVE mg/dL
KETONES UR: NEGATIVE mg/dL
Nitrite: NEGATIVE
Protein, ur: NEGATIVE mg/dL
Specific Gravity, Urine: 1.008 (ref 1.005–1.030)
Urobilinogen, UA: 0.2 mg/dL (ref 0.0–1.0)
pH: 7 (ref 5.0–8.0)

## 2013-08-25 LAB — CBC WITH DIFFERENTIAL/PLATELET
BASOS PCT: 0 % (ref 0–1)
Basophils Absolute: 0 10*3/uL (ref 0.0–0.1)
Eosinophils Absolute: 0.2 10*3/uL (ref 0.0–0.7)
Eosinophils Relative: 3 % (ref 0–5)
HEMATOCRIT: 38.7 % (ref 36.0–46.0)
Hemoglobin: 13.3 g/dL (ref 12.0–15.0)
Lymphocytes Relative: 46 % (ref 12–46)
Lymphs Abs: 3.2 10*3/uL (ref 0.7–4.0)
MCH: 29.5 pg (ref 26.0–34.0)
MCHC: 34.4 g/dL (ref 30.0–36.0)
MCV: 85.8 fL (ref 78.0–100.0)
MONO ABS: 0.5 10*3/uL (ref 0.1–1.0)
Monocytes Relative: 7 % (ref 3–12)
Neutro Abs: 3.1 10*3/uL (ref 1.7–7.7)
Neutrophils Relative %: 45 % (ref 43–77)
Platelets: 341 10*3/uL (ref 150–400)
RBC: 4.51 MIL/uL (ref 3.87–5.11)
RDW: 12.5 % (ref 11.5–15.5)
WBC: 6.9 10*3/uL (ref 4.0–10.5)

## 2013-08-25 LAB — URINE MICROSCOPIC-ADD ON

## 2013-08-25 LAB — COMPREHENSIVE METABOLIC PANEL
ALBUMIN: 3.7 g/dL (ref 3.5–5.2)
ALT: 9 U/L (ref 0–35)
AST: 13 U/L (ref 0–37)
Alkaline Phosphatase: 45 U/L (ref 39–117)
BUN: 11 mg/dL (ref 6–23)
CO2: 25 mEq/L (ref 19–32)
CREATININE: 0.68 mg/dL (ref 0.50–1.10)
Calcium: 9.1 mg/dL (ref 8.4–10.5)
Chloride: 103 mEq/L (ref 96–112)
GFR calc Af Amer: >90 mL/min (ref >90)
GFR calc non Af Amer: >90 mL/min (ref >90)
Glucose, Bld: 95 mg/dL (ref 70–99)
Potassium: 3.9 mEq/L (ref 3.7–5.3)
SODIUM: 138 meq/L (ref 137–147)
TOTAL PROTEIN: 7.1 g/dL (ref 6.0–8.3)
Total Bilirubin: 0.2 mg/dL — ABNORMAL LOW (ref 0.3–1.2)

## 2013-08-25 LAB — PREGNANCY, URINE: PREG TEST UR: NEGATIVE

## 2013-08-25 LAB — LIPASE, BLOOD: Lipase: 66 U/L — ABNORMAL HIGH (ref 11–59)

## 2013-08-25 MED ORDER — ONDANSETRON HCL 4 MG PO TABS: 4.0000 mg | ORAL_TABLET | Freq: Four times a day (QID) | ORAL | Status: DC

## 2013-08-25 MED ORDER — SODIUM CHLORIDE 0.9 % IV BOLUS (SEPSIS)
1000.0000 mL | Freq: Once | INTRAVENOUS | Status: AC
  Administered 2013-08-25: 1000 mL via INTRAVENOUS

## 2013-08-25 MED ORDER — ONDANSETRON HCL 4 MG/2ML IJ SOLN
4.0000 mg | Freq: Once | INTRAMUSCULAR | Status: AC
  Administered 2013-08-25: 4 mg via INTRAVENOUS
  Filled 2013-08-25: qty 2

## 2013-08-25 NOTE — ED Provider Notes (Signed)
CSN: [id number]     Arrival date & time 08/25/13  2019 History   First MD Initiated Contact with Patient 08/25/13 2036     Chief Complaint  Patient presents with  . Abdominal Pain  . Nausea  . Emesis  . Diarrhea     (Consider location/radiation/quality/duration/timing/severity/associated sxs/prior Treatment) The history is provided by the patient. No language interpreter was used.  Ashley Li is a 33 y/o F with no known significant PMHx presenting to the ED with nausea, vomiting, diarrhea, and abdominal pain that started this morning abruptly at approximately 3:00AM once the patient was leaving work. Reported that the abdominal pain is localized to the umbilical region described as a "gurgling" sensation - denied radiation. Stated that she's had at least 3-4 episodes of diarrhea, reported that they were very loose. Stated 1 episode of emesis mainly of food contents. Stated that she has nausea. Reported that she's using ibuprofen with minimal relief. Stated that she is on Implanon - LMP last month. Stated that her and her boyfriend ate out last night, reported that she ate chicken and shrimp pasta - reported that her boyfriend ate the same dish with no reaction. Stated that her sister was sick with similar symptoms this weekend. Reported that many individuals at work are sick. Denied fever, chills, neck pain, neck stiffness, dysuria, hematuria, melena, hematochezia, chest pain, shortness of breath, difficulty breathing, cough, nasal congestion. PCP [facility]  No past medical history on file. No past surgical history on file. History reviewed. No pertinent family history. History  Substance Use Topics  . Smoking status: Never Smoker   . Smokeless tobacco: Never Used  . Alcohol Use: Yes     Comment: socially    OB History   Grav Para Term Preterm Abortions TAB SAB Ect Mult Living                 Review of Systems  Constitutional: Negative for fever and  chills.  HENT: Negative for trouble swallowing.   Respiratory: Negative for cough, chest tightness and shortness of breath.   Cardiovascular: Negative for chest pain.  Gastrointestinal: Positive for nausea, vomiting, abdominal pain and diarrhea. Negative for constipation, blood in stool, anal bleeding and rectal pain.  Genitourinary: Negative for dysuria, decreased urine volume, vaginal bleeding, vaginal discharge and vaginal pain.  Musculoskeletal: Positive for back pain. Negative for neck pain.  Neurological: Negative for dizziness and weakness.  All other systems reviewed and are negative.      Allergies  Review of patient's allergies indicates no known allergies.  Home Medications   Current Outpatient Rx  Name  Route  Sig  Dispense  Refill  . etonogestrel (IMPLANON) 68 MG IMPL implant   Subcutaneous   Inject 1 each into the skin once.         Marland Kitchen ibuprofen (ADVIL,MOTRIN) 200 MG tablet   Oral   Take 200 mg by mouth every 6 (six) hours as needed for mild pain.         Marland Kitchen ondansetron (ZOFRAN) 4 MG tablet   Oral   Take 1 tablet (4 mg total) by mouth every 6 (six) hours.   12 tablet   0    BP 134/86  Pulse 66  Temp(Src) 98.6 F (37 C) (Oral)  Resp 18  SpO2 100%  LMP 07/03/2013 Physical Exam  Nursing note and vitals reviewed. Constitutional: She is oriented to person, place, and time. She appears well-developed and well-nourished. No distress.  Patient sitting currently upright in bed  HENT:  Head: Normocephalic and atraumatic.  Mouth/Throat: Oropharynx is clear and moist. No oropharyngeal exudate.  Eyes: Conjunctivae and EOM are normal. Pupils are equal, round, and reactive to light. Right eye exhibits no discharge. Left eye exhibits no discharge.  Neck: Normal range of motion. Neck supple.  Cardiovascular: Normal rate, regular rhythm and normal heart sounds.   Pulses:      Radial pulses are 2+ on the right side, and 2+ on the left side.       Dorsalis pedis  pulses are 2+ on the right side, and 2+ on the left side.  Pulmonary/Chest: Effort normal and breath sounds normal. No respiratory distress. She has no wheezes. She has no rales.  Abdominal: Soft. Bowel sounds are normal.  Musculoskeletal: Normal range of motion.  Full ROM to upper and lower extremities without difficulty noted, negative ataxia noted.  Neurological: She is alert and oriented to person, place, and time. No cranial nerve deficit. She exhibits normal muscle tone. Coordination normal.  Skin: Skin is warm and dry. No rash noted. She is not diaphoretic. No erythema.  Psychiatric: She has a normal mood and affect. Her behavior is normal. Thought content normal.    ED Course  Procedures (including critical care time)  11:10 PM This provider was made aware that the patient wanted to leave, reported that she is feeling better. This provider re-assessed the patient. Patient reported that she is feeling better. Reported that she does not feel nauseous.   Results for orders placed during the hospital encounter of 08/25/13  CBC WITH DIFFERENTIAL      Result Value Range   WBC 6.9  4.0 - 10.5 K/uL   RBC 4.51  3.87 - 5.11 MIL/uL   Hemoglobin 13.3  12.0 - 15.0 g/dL   HCT 38.7  36.0 - 46.0 %   MCV 85.8  78.0 - 100.0 fL   MCH 29.5  26.0 - 34.0 pg   MCHC 34.4  30.0 - 36.0 g/dL   RDW 12.5  11.5 - 15.5 %   Platelets 341  150 - 400 K/uL   Neutrophils Relative % 45  43 - 77 %   Neutro Abs 3.1  1.7 - 7.7 K/uL   Lymphocytes Relative 46  12 - 46 %   Lymphs Abs 3.2  0.7 - 4.0 K/uL   Monocytes Relative 7  3 - 12 %   Monocytes Absolute 0.5  0.1 - 1.0 K/uL   Eosinophils Relative 3  0 - 5 %   Eosinophils Absolute 0.2  0.0 - 0.7 K/uL   Basophils Relative 0  0 - 1 %   Basophils Absolute 0.0  0.0 - 0.1 K/uL  COMPREHENSIVE METABOLIC PANEL      Result Value Range   Sodium 138  137 - 147 mEq/L   Potassium 3.9  3.7 - 5.3 mEq/L   Chloride 103  96 - 112 mEq/L   CO2 25  19 - 32 mEq/L   Glucose, Bld  95  70 - 99 mg/dL   BUN 11  6 - 23 mg/dL   Creatinine, Ser 0.68  0.50 - 1.10 mg/dL   Calcium 9.1  8.4 - 10.5 mg/dL   Total Protein 7.1  6.0 - 8.3 g/dL   Albumin 3.7  3.5 - 5.2 g/dL   AST 13  0 - 37 U/L   ALT 9  0 - 35 U/L   Alkaline Phosphatase 45  39 - 117  U/L   Total Bilirubin 0.2 (*) 0.3 - 1.2 mg/dL   GFR calc non Af Amer >90  >90 mL/min   GFR calc Af Amer >90  >90 mL/min  LIPASE, BLOOD      Result Value Range   Lipase 66 (*) 11 - 59 U/L  PREGNANCY, URINE      Result Value Range   Preg Test, Ur NEGATIVE  NEGATIVE  URINALYSIS, ROUTINE W REFLEX MICROSCOPIC      Result Value Range   Color, Urine YELLOW  YELLOW   APPearance CLOUDY (*) CLEAR   Specific Gravity, Urine 1.008  1.005 - 1.030   pH 7.0  5.0 - 8.0   Glucose, UA NEGATIVE  NEGATIVE mg/dL   Hgb urine dipstick TRACE (*) NEGATIVE   Bilirubin Urine NEGATIVE  NEGATIVE   Ketones, ur NEGATIVE  NEGATIVE mg/dL   Protein, ur NEGATIVE  NEGATIVE mg/dL   Urobilinogen, UA 0.2  0.0 - 1.0 mg/dL   Nitrite NEGATIVE  NEGATIVE   Leukocytes, UA SMALL (*) NEGATIVE  URINE MICROSCOPIC-ADD ON      Result Value Range   Squamous Epithelial / LPF FEW (*) RARE   WBC, UA 3-6  <3 WBC/hpf   RBC / HPF 0-2  <3 RBC/hpf   Bacteria, UA RARE  RARE    Labs Review Labs Reviewed  COMPREHENSIVE METABOLIC PANEL - Abnormal; Notable for the following:    Total Bilirubin 0.2 (*)    All other components within normal limits  LIPASE, BLOOD - Abnormal; Notable for the following:    Lipase 66 (*)    All other components within normal limits  URINALYSIS, ROUTINE W REFLEX MICROSCOPIC - Abnormal; Notable for the following:    APPearance CLOUDY (*)    Hgb urine dipstick TRACE (*)    Leukocytes, UA SMALL (*)    All other components within normal limits  URINE MICROSCOPIC-ADD ON - Abnormal; Notable for the following:    Squamous Epithelial / LPF FEW (*)    All other components within normal limits  CBC WITH DIFFERENTIAL  PREGNANCY, URINE   Imaging  Review No results found.  EKG Interpretation   None       MDM   Final diagnoses:  Viral gastroenteritis   Medications  sodium chloride 0.9 % bolus 1,000 mL (0 mLs Intravenous Stopped 08/25/13 2230)  ondansetron (ZOFRAN) injection 4 mg (4 mg Intravenous Given 08/25/13 2143)   Filed Vitals:   08/25/13 2026 08/25/13 2302  BP: 134/86   Pulse: 73 66  Temp: 98 F (36.7 C) 98.6 F (37 C)  TempSrc: Oral Oral  Resp:  18  SpO2: 99% 100%    Patient presenting to emergency department with abdominal pain, nausea, vomiting, diarrhea that started this morning at approximately 3:00 AM. Patient reported that she had at least one episode of emesis mainly of food contents, 3-4 episodes of diarrhea. Reported pain to be a "gurgling" sensation localized to the umbilical region without radiation. Alert and oriented. GCS 15. Heart rate and rhythm normal. Lungs clear to auscultation. Radial and DP pulses 2+ bilaterally. Bowel sounds normoactive in all 4 quadrants-soft upon palpation-negative discomfort noted. Nonsurgical abdomen noted, negative acute abdomen, negative peritoneal signs. CBC negative findings. CMP negative findings. Lipase mild elevation of 66. Urine pregnancy negative. Doubt pancreatitis. Doubt appendicitis. Doubt acute abdominal processes. Suspicion is bowel gastroenteritis secondary to nausea, vomiting diarrhea. Nausea controlled while in ED setting. Patient is able to tolerate fluids and food by mouth without difficulty. Negative episodes  of emesis while in ED setting. Patient stable, afebrile. Negative signs of sepsis. Discharged patient. Discharge patient with Zofran. Referred patient to primary care provider. Discussed with patient to rest and stay hydrated. Discussed with patient proper diet. Discussed with patient to closely monitor symptoms and if symptoms are to worsen or change to report back to the ED - strict return instructions given.  Patient agreed to plan of care, understood,  all questions answered.   Jamse Mead, PA-C 08/26/13 1238

## 2013-08-25 NOTE — Discharge Instructions (Signed)
Please call your doctor for a followup appointment within 24-48 hours. When you talk to your doctor please let them know that you were seen in the emergency department and have them acquire all of your records so that they can discuss the findings with you and formulate a treatment plan to fully care for your new and ongoing problems. Please call and set up an appointment with her primary care provider to be reassessed within the next 24-48 hours Please take Zofran as needed for nausea control Please avoid any fatty, greasy, oily foods Please rest and drink plenty of fluids Please continue to monitor symptoms closely and if symptoms are to worsen or change (fever greater than 101, chills, chest pain, shortness of breath, difficulty breathing, nausea, vomiting, changes to abdominal pain, weakness, blood in urine, pain with urination, black for stools, bloody stools, worsening diarrhea, inability to keep food or fluid down) please report back to emergency department immediately  Viral Gastroenteritis Viral gastroenteritis is also known as stomach flu. This condition affects the stomach and intestinal tract. It can cause sudden diarrhea and vomiting. The illness typically lasts 3 to 8 days. Most people develop an immune response that eventually gets rid of the virus. While this natural response develops, the virus can make you quite ill. CAUSES  Many different viruses can cause gastroenteritis, such as rotavirus or noroviruses. You can catch one of these viruses by consuming contaminated food or water. You may also catch a virus by sharing utensils or other personal items with an infected person or by touching a contaminated surface. SYMPTOMS  The most common symptoms are diarrhea and vomiting. These problems can cause a severe loss of body fluids (dehydration) and a body salt (electrolyte) imbalance. Other symptoms may include:  Fever.  Headache.  Fatigue.  Abdominal pain. DIAGNOSIS  Your  caregiver can usually diagnose viral gastroenteritis based on your symptoms and a physical exam. A stool sample may also be taken to test for the presence of viruses or other infections. TREATMENT  This illness typically goes away on its own. Treatments are aimed at rehydration. The most serious cases of viral gastroenteritis involve vomiting so severely that you are not able to keep fluids down. In these cases, fluids must be given through an intravenous line (IV). HOME CARE INSTRUCTIONS   Drink enough fluids to keep your urine clear or pale yellow. Drink small amounts of fluids frequently and increase the amounts as tolerated.  Ask your caregiver for specific rehydration instructions.  Avoid:  Foods high in sugar.  Alcohol.  Carbonated drinks.  Tobacco.  Juice.  Caffeine drinks.  Extremely hot or cold fluids.  Fatty, greasy foods.  Too much intake of anything at one time.  Dairy products until 24 to 48 hours after diarrhea stops.  You may consume probiotics. Probiotics are active cultures of beneficial bacteria. They may lessen the amount and number of diarrheal stools in adults. Probiotics can be found in yogurt with active cultures and in supplements.  Wash your hands well to avoid spreading the virus.  Only take over-the-counter or prescription medicines for pain, discomfort, or fever as directed by your caregiver. Do not give aspirin to children. Antidiarrheal medicines are not recommended.  Ask your caregiver if you should continue to take your regular prescribed and over-the-counter medicines.  Keep all follow-up appointments as directed by your caregiver. SEEK IMMEDIATE MEDICAL CARE IF:   You are unable to keep fluids down.  You do not urinate at least once every  6 to 8 hours.  You develop shortness of breath.  You notice blood in your stool or vomit. This may look like coffee grounds.  You have abdominal pain that increases or is concentrated in one small  area (localized).  You have persistent vomiting or diarrhea.  You have a fever.  The patient is a child younger than 3 months, and he or she has a fever.  The patient is a child older than 3 months, and he or she has a fever and persistent symptoms.  The patient is a child older than 3 months, and he or she has a fever and symptoms suddenly get worse.  The patient is a baby, and he or she has no tears when crying. MAKE SURE YOU:   Understand these instructions.  Will watch your condition.  Will get help right away if you are not doing well or get worse. Document Released: 07/02/2005 Document Revised: 09/24/2011 Document Reviewed: 04/18/2011 [facility] Patient Information 2014 [place].   Diet for Diarrhea, Adult Frequent, runny stools (diarrhea) may be caused or worsened by food or drink. Diarrhea may be relieved by changing your diet. Since diarrhea can last up to 7 days, it is easy for you to lose too much fluid from the body and become dehydrated. Fluids that are lost need to be replaced. Along with a modified diet, make sure you drink enough fluids to keep your urine clear or pale yellow. DIET INSTRUCTIONS  Ensure adequate fluid intake (hydration): have 1 cup (8 oz) of fluid for each diarrhea episode. Avoid fluids that contain simple sugars or sports drinks, fruit juices, whole milk products, and sodas. Your urine should be clear or pale yellow if you are drinking enough fluids. Hydrate with an oral rehydration solution that you can purchase at pharmacies, retail stores, and online. You can prepare an oral rehydration solution at home by mixing the following ingredients together:    tsp table salt.   tsp baking soda.   tsp salt substitute containing potassium chloride.  1  tablespoons sugar.  1 L (34 oz) of water.  Certain foods and beverages may increase the speed at which food moves through the gastrointestinal (GI) tract. These foods and beverages should be  avoided and include:  Caffeinated and alcoholic beverages.  High-fiber foods, such as raw fruits and vegetables, nuts, seeds, and whole grain breads and cereals.  Foods and beverages sweetened with sugar alcohols, such as xylitol, sorbitol, and mannitol.  Some foods may be well tolerated and may help thicken stool including:  Starchy foods, such as rice, toast, pasta, low-sugar cereal, oatmeal, grits, baked potatoes, crackers, and bagels.   Bananas.   Applesauce.  Add probiotic-rich foods to help increase healthy bacteria in the GI tract, such as yogurt and fermented milk products. RECOMMENDED FOODS AND BEVERAGES Starches Choose foods with less than 2 g of fiber per serving.  Recommended:  White, [place], and pita breads, plain rolls, buns, bagels. Plain muffins, matzo. Soda, saltine, or graham crackers. Pretzels, melba toast, zwieback. Cooked cereals made with water: cornmeal, farina, cream cereals. Dry cereals: refined corn, wheat, rice. Potatoes prepared any way without skins, refined macaroni, spaghetti, noodles, refined rice.  Avoid:  Bread, rolls, or crackers made with whole wheat, multi-grains, rye, bran seeds, nuts, or coconut. Corn tortillas or taco shells. Cereals containing whole grains, multi-grains, bran, coconut, nuts, raisins. Cooked or dry oatmeal. Coarse wheat cereals, granola. Cereals advertised as "high-fiber." Potato skins. Whole grain pasta, wild or brown rice. Popcorn.  Sweet potatoes, yams. Sweet rolls, doughnuts, waffles, pancakes, sweet breads. Vegetables  Recommended: Strained tomato and vegetable juices. Most well-cooked and canned vegetables without seeds. Fresh: Tender lettuce, cucumber without the skin, cabbage, spinach, bean sprouts.  Avoid: Fresh, cooked, or canned: Artichokes, baked beans, beet greens, broccoli, Brussels sprouts, corn, kale, legumes, peas, sweet potatoes. Cooked: Green or red cabbage, spinach. Avoid large servings of any vegetables because  vegetables shrink when cooked, and they contain more fiber per serving than fresh vegetables. Fruit  Recommended: Cooked or canned: Apricots, applesauce, cantaloupe, cherries, fruit cocktail, grapefruit, grapes, kiwi, mandarin oranges, peaches, pears, plums, watermelon. Fresh: Apples without skin, ripe banana, grapes, cantaloupe, cherries, grapefruit, peaches, oranges, plums. Keep servings limited to  cup or 1 piece.  Avoid: Fresh: Apples with skin, apricots, mangoes, pears, raspberries, strawberries. Prune juice, stewed or dried prunes. Dried fruits, raisins, dates. Large servings of all fresh fruits. Protein  Recommended: Ground or well-cooked tender beef, ham, veal, lamb, pork, or poultry. Eggs. Fish, oysters, shrimp, lobster, other seafoods. Liver, organ meats.  Avoid: Tough, fibrous meats with gristle. Peanut butter, smooth or chunky. Cheese, nuts, seeds, legumes, dried peas, beans, lentils. Dairy  Recommended: Yogurt, lactose-free milk, kefir, drinkable yogurt, buttermilk, soy milk, or plain hard cheese.  Avoid: Milk, chocolate milk, beverages made with milk, such as milkshakes. Soups  Recommended: Bouillon, broth, or soups made from allowed foods. Any strained soup.  Avoid: Soups made from vegetables that are not allowed, cream or milk-based soups. Desserts and Sweets  Recommended: Sugar-free gelatin, sugar-free frozen ice pops made without sugar alcohol.  Avoid: Plain cakes and cookies, pie made with fruit, pudding, custard, cream pie. Gelatin, fruit, ice, sherbet, frozen ice pops. Ice cream, ice milk without nuts. Plain hard candy, honey, jelly, molasses, syrup, sugar, chocolate syrup, gumdrops, marshmallows. Fats and Oils  Recommended: Limit fats to less than 8 tsp per day.  Avoid: Seeds, nuts, olives, avocados. Margarine, butter, cream, mayonnaise, salad oils, plain salad dressings. Plain gravy, crisp bacon without rind. Beverages  Recommended: Water, decaffeinated teas,  oral rehydration solutions, sugar-free beverages not sweetened with sugar alcohols.  Avoid: Fruit juices, caffeinated beverages (coffee, tea, soda), alcohol, sports drinks, or lemon-lime soda. Condiments  Recommended: Ketchup, mustard, horseradish, vinegar, cocoa powder. Spices in moderation: allspice, basil, bay leaves, celery powder or leaves, cinnamon, cumin powder, curry powder, ginger, mace, marjoram, onion or garlic powder, oregano, paprika, parsley flakes, ground pepper, rosemary, sage, savory, tarragon, thyme, turmeric.  Avoid: Coconut, honey. Document Released: 09/22/2003 Document Revised: 03/26/2012 Document Reviewed: 11/16/2011 [facility] Patient Information 2014 [place].   Emergency Department Resource Guide 1) Find a Doctor and Pay Out of Pocket Although you won't have to find out who is covered by your insurance plan, it is a good idea to ask around and get recommendations. You will then need to call the office and see if the doctor you have chosen will accept you as a new patient and what types of options they offer for patients who are self-pay. Some doctors offer discounts or will set up payment plans for their patients who do not have insurance, but you will need to ask so you aren't surprised when you get to your appointment.  2) Contact Your Local Health Department Not all health departments have doctors that can see patients for sick visits, but many do, so it is worth a call to see if yours does. If you don't know where your local health department is, you can check in your phone book. The CDC  also has a tool to help you locate your state's health department, and many state websites also have listings of all of their local health departments.  3) Find a [facility] If your illness is not likely to be very severe or complicated, you may want to try a walk in clinic. These are popping up all over the country in pharmacies, drugstores, and shopping centers. They're  usually staffed by nurse practitioners or physician assistants that have been trained to treat common illnesses and complaints. They're usually fairly quick and inexpensive. However, if you have serious medical issues or chronic medical problems, these are probably not your best option.  No Primary Care Doctor: - Call Health Connect at  [phone] - they can help you locate a primary care doctor that  accepts your insurance, provides certain services, etc. - Physician Referral Service- [phone]  Chronic Pain Problems: Organization         Address  Phone   Notes  [facility]  [phone] Patients need to be referred by their primary care doctor.   Medication Assistance: Organization         Address  Phone   Notes  [facility] Medication [facility] [address] [place], [place] [address] [phone] --Must be a resident of [facility] -- Must have NO insurance coverage whatsoever (no Medicaid/ Medicare, etc.) -- The pt. MUST have a primary care doctor that directs their care regularly and follows them in the community   MedAssist  [phone]   [employer]  [phone]    Agencies that provide inexpensive medical care: Organization         Address  Phone   Notes  [place]  [phone]   [facility]    [phone]   [facility] [place], [place] [address] [phone]   [place] [address], [place] [phone]   Planned Parenthood    [phone]   [facility]    [phone]   [place] and [address], [place] Phone:  [phone], Fax:  [phone] Hours of Operation:  9 am - 6 pm, M-F.  Also accepts Medicaid/Medicare and self-pay.  [facility] for [place] [address], [address], [place] Phone: [phone], Fax: [phone]. Hours  of Operation:  8:30 am - 5:30 pm, M-F.  Also accepts Medicaid and self-pay.  [facility] [place] [address] [place] Phone: [phone]   [place], [place], [place] [phone], Ext. [phone] Mondays & Thursdays: 7-9 AM.  First 15 patients are seen on a first come, first serve basis.    [place] Providers:  Organization         Address  Phone   Notes  [facility] [address], [place] [phone] Also accepts self-pay patients.  [facility] [id number] [place], [place]  [phone]   [place], [address], [place] [phone]   [facility] [address], [place] [phone]   Lucianne Lei [address], [place]   [phone] Only accepts [place] Access [place] patients after they have their name applied to their card.   Self-Pay (no insurance)  in [facility]:  Organization         Address  Phone   Notes  Sickle Cell Patients, [facility] [place] [phone]   [facility] Urgent Care [place] [phone]   [facility] [place]  [address], [place] [phone]   [facility]/Dr. Osei-Bonsu  [address] [place] or [address], [place] [phone] Phone number for both [place] and [place] locations is the same.  Urgent Medical and [facility] [place] [address] [place] [phone]   [facility] [address], [place] or [address] [phone]   [facility] [place] [address] [place] [phone], phone; [phone], fax Sees patients 1st and 3rd Saturday of every month.  Must not qualify for public or private insurance (i.e. Medicaid,  Medicare, [place] Health Choice, Veterans' Benefits)  Household income should be no more than 200% of the poverty level The clinic cannot treat you if you are pregnant or think you are pregnant  Sexually transmitted diseases are not treated at the clinic.    Dental Care: Organization         Address  Phone  Notes  [facility] Department of [facility] [place] [phone] Accepts children up to age 67 who are enrolled in [place] or [place]; pregnant women with a Medicaid card; and children who have applied for Medicaid or [place] Health Choice, but were declined, whose parents can pay a reduced fee at time of service.  [facility] Department of [facility]  [address], [place] [phone] Accepts children up to age 82 who are enrolled in [place] or [place]; pregnant women with a Medicaid card; and children who have applied for Medicaid or  Health Choice, but were declined, whose parents can pay a reduced fee at time of service.  [place] Adult Dental Access PROGRAM  [place] [phone] Patients are seen by appointment only. Walk-ins are not accepted. [employer] will see patients 61 years of age and older. Monday - Tuesday (8am-5pm) Most Wednesdays (8:30-5pm) $30 per visit, cash only  [facility] Adult Dental Access PROGRAM  [address], [facility] [phone] Patients are seen by appointment only. Walk-ins are not accepted. [place] will see patients 69 years of age and older. One Wednesday Evening (Monthly: Volunteer Based).  $30 per visit, cash only  [place]  [phone] for adults; Children under age 28, call Graduate Pediatric Dentistry at [phone]. Children aged 55-14, please call [phone] to request a pediatric application.  Dental services are provided in all areas of dental care including fillings, crowns  and bridges, complete and partial dentures, implants, gum treatment, root canals, and extractions. Preventive care is also provided. Treatment is provided to both adults and children. Patients are selected via a lottery and there is often a waiting list.   [facility] [address] [place]  [phone] [email or web address]   [facility] [address] [place], [place] [phone], Ext. [phone] Second and Fourth Thursday of each month, opens at 6:30 AM; Clinic ends at 9 AM.  Patients are seen on a first-come first-served basis, and a limited number are seen during  each clinic.   [facility]  [address] Hillard Danker [place], [place] [phone]   Eligibility Requirements You must have lived in [place], [place], or [place] counties for at least the last three months.   You cannot be eligible for state or federal sponsored [employer], including [employer], [place], or [employer].   You generally cannot be eligible for healthcare insurance through your employer.    How to apply: Eligibility screenings are held every Tuesday and Wednesday afternoon from 1:00 pm until 4:00 pm. You do not need an appointment for the interview!  [facility] [address], [place], [place]   [place]  [place] Department  [place]  [phone]    Behavioral Health Resources in the Community: Intensive Outpatient Programs Organization         Address  Phone  Notes  [place]. [address], [place], [place] [phone]   [facility] Outpatient [address] [place], [place]   ADS: Alcohol & Drug Svcs [address] [place], [place]   [place] [address],  [place], [place] or [phone]   Substance Abuse  Resources Organization         Address  Phone  Notes  Alcohol and Drug Services  [phone]   [place]  [phone]   [facility] [place]   Chinita Pester  [phone]   Residential & Outpatient Substance Abuse Program  [phone]   Psychological Services Organization         Address  Phone  Notes  [facility] [place]  [place]  [phone]   [place] [address], [place] or [phone]    Mobile Crisis Teams Organization         Address  Phone  Notes  [facility]atives, Mobile Crisis Care Unit  [phone]   [facility]  [address] [place], [place]   [place] [address] [place] [phone]    Self-Help/Support Groups Organization         Address  Phone             Notes  [place]. of [place] - variety of support groups  [place] Call for more information  Narcotics Anonymous (NA), [facility]aring [facility] [address], [employer] [place]  2 meetings at this location   Special educational needs teacher         Address  Phone  Notes  [facility] Residential Treatment [place],    [place]  [phone]   [facility]  [address] [place] [id number], [place], [place]   [place], [place] [phone] Admissions: 8am-3pm M-F  Incentives Substance [place] [address],    [place], [place] [phone]   [facility] [address] Jadene Pierini [place], [place]   The [facility] [address]  [place], [place]   [facility] Programs - Intensive Outpatient [address] Kristeen [address], [place], [place]   [facility] ([place].,  [place], [place] or [phone]   [facility]idential Treatment Services (RTS) [address] [place], [place] Accepts Medicaid  [facility] [address],  [place]  [place] [phone] Substance Abuse/Addiction Treatment   [facility] Organization         Address  Phone  Notes  [facility]  [phone]   Domenic Schwab, PhD [address] Arlis [place], [place]   [phone] or [phone]   [place], [place] [phone]   [place] [address], [place], [place] [phone] Insurance/Medicaid/sponsorship through [facility] and Families [address] [place]                                    [place], [place] [phone] [place] [address], [place] [phone]    Dr. Adele Schilder  [phone]   [facility] [place] Dept. 1) [address], [place] 2) [place] 3)  [place], [place] [phone]  [phone]   [place] [phone] or [phone] (After Hours)

## 2013-08-25 NOTE — ED Notes (Signed)
Pt states around 3 am this morning her stomach began to hurt,and then she got diarrhea and nausea,  Then vomited once but has continued with diarrhea and now she feels weak.  Pt is alert and oriented in NAD

## 2013-08-25 NOTE — ED Notes (Signed)
Pt able to drink a cup of water--- tolerated well; no nausea or vomiting.

## 2013-08-28 NOTE — ED Provider Notes (Signed)
Medical screening examination/treatment/procedure(s) were performed by non-physician practitioner and as supervising physician I was immediately available for consultation/collaboration.  EKG Interpretation   None         [place], DO 08/28/13 1457

## 2014-01-05 ENCOUNTER — Encounter (HOSPITAL_COMMUNITY): Payer: Self-pay | Admitting: Emergency Medicine

## 2014-01-05 ENCOUNTER — Emergency Department (HOSPITAL_COMMUNITY)
Admission: EM | Admit: 2014-01-05 | Discharge: 2014-01-05 | Disposition: A | Discharge status: Home / Self Care | Payer: No Typology Code available for payment source | Attending: Emergency Medicine | Admitting: Emergency Medicine

## 2014-01-05 DIAGNOSIS — N3000 Acute cystitis without hematuria: Secondary | ICD-10-CM | POA: Insufficient documentation

## 2014-01-05 DIAGNOSIS — Z3202 Encounter for pregnancy test, result negative: Secondary | ICD-10-CM | POA: Insufficient documentation

## 2014-01-05 DIAGNOSIS — N3001 Acute cystitis with hematuria: Secondary | ICD-10-CM

## 2014-01-05 DIAGNOSIS — Z792 Long term (current) use of antibiotics: Secondary | ICD-10-CM | POA: Insufficient documentation

## 2014-01-05 DIAGNOSIS — Z79899 Other long term (current) drug therapy: Secondary | ICD-10-CM | POA: Insufficient documentation

## 2014-01-05 DIAGNOSIS — R350 Frequency of micturition: Secondary | ICD-10-CM | POA: Insufficient documentation

## 2014-01-05 LAB — URINE MICROSCOPIC-ADD ON

## 2014-01-05 LAB — URINALYSIS, ROUTINE W REFLEX MICROSCOPIC
BILIRUBIN URINE: NEGATIVE
Glucose, UA: NEGATIVE mg/dL
Ketones, ur: NEGATIVE mg/dL
Nitrite: NEGATIVE
Protein, ur: NEGATIVE mg/dL
SPECIFIC GRAVITY, URINE: 1.018 (ref 1.005–1.030)
UROBILINOGEN UA: 0.2 mg/dL (ref 0.0–1.0)
pH: 7.5 (ref 5.0–8.0)

## 2014-01-05 LAB — PREGNANCY, URINE: Preg Test, Ur: NEGATIVE

## 2014-01-05 MED ORDER — PHENAZOPYRIDINE HCL 100 MG PO TABS
200.0000 mg | ORAL_TABLET | Freq: Once | ORAL | Status: AC
  Administered 2014-01-05: 200 mg via ORAL
  Filled 2014-01-05: qty 2

## 2014-01-05 MED ORDER — CEPHALEXIN 250 MG PO CAPS
500.0000 mg | ORAL_CAPSULE | Freq: Once | ORAL | Status: AC
  Administered 2014-01-05: 500 mg via ORAL
  Filled 2014-01-05: qty 2

## 2014-01-05 MED ORDER — CEPHALEXIN 500 MG PO CAPS: 500.0000 mg | ORAL_CAPSULE | Freq: Three times a day (TID) | ORAL | Status: DC

## 2014-01-05 MED ORDER — PHENAZOPYRIDINE HCL 200 MG PO TABS: 200.0000 mg | ORAL_TABLET | Freq: Three times a day (TID) | ORAL | Status: DC

## 2014-01-05 NOTE — ED Notes (Signed)
Pt states that she was having painful urination for a few days and today started urinating blood. Pt states that she is still having pain in her pelvic area as well.

## 2014-01-05 NOTE — ED Notes (Signed)
This AM patient noted that she had pain with urination. Denies vaginal discharge  States she noticed blood in her urine while at home

## 2014-01-05 NOTE — Discharge Instructions (Signed)

## 2014-01-05 NOTE — ED Provider Notes (Signed)
CSN: [id number]     Arrival date & time 01/05/14  2009 History   First MD Initiated Contact with Patient 01/05/14 2011     Chief Complaint  Patient presents with  . Hematuria      HPI Patient presents with urinary frequency and hematuria that developed today.  No fevers or chills.  No nausea or vomiting.  She states it feels like a prior urinary tract infection that she has had.  She does report more intercourse than normal this weekend.  Symptoms are mild to moderate in severity.  Nothing worsens or improves her symptoms.   History reviewed. No pertinent past medical history. History reviewed. No pertinent past surgical history. History reviewed. No pertinent family history. History  Substance Use Topics  . Smoking status: Never Smoker   . Smokeless tobacco: Never Used  . Alcohol Use: Yes     Comment: socially    OB History   Grav Para Term Preterm Abortions TAB SAB Ect Mult Living                 Review of Systems  All other systems reviewed and are negative.     Allergies  Review of patient's allergies indicates no known allergies.  Home Medications   Prior to Admission medications   Medication Sig Start Date End Date Taking? Authorizing Provider  etonogestrel (IMPLANON) 68 MG IMPL implant Inject 1 each into the skin once.   Yes Historical Provider, MD  cephALEXin (KEFLEX) 500 MG capsule Take 1 capsule (500 mg total) by mouth 3 (three) times daily. 01/05/14   Hoy Morn, MD  phenazopyridine (PYRIDIUM) 200 MG tablet Take 1 tablet (200 mg total) by mouth 3 (three) times daily with meals. 01/05/14   Hoy Morn, MD   BP 119/77  Pulse 73  Temp(Src) 98.1 F (36.7 C) (Oral)  Resp 18  Ht 5\' 4"  (1.626 m)  Wt 161 lb (73.029 kg)  BMI 27.62 kg/m2  SpO2 100%  LMP 11/05/2013 Physical Exam  Nursing note and vitals reviewed. Constitutional: She is oriented to person, place, and time. She appears well-developed and well-nourished. No distress.  HENT:  Head:  Normocephalic and atraumatic.  Eyes: EOM are normal.  Neck: Normal range of motion.  Cardiovascular: Normal rate, regular rhythm and normal heart sounds.   Pulmonary/Chest: Effort normal and breath sounds normal.  Abdominal: Soft. She exhibits no distension. There is no tenderness.  Musculoskeletal: Normal range of motion.  Neurological: She is alert and oriented to person, place, and time.  Skin: Skin is warm and dry.  Psychiatric: She has a normal mood and affect. Judgment normal.    ED Course  Procedures (including critical care time) Labs Review Labs Reviewed  URINALYSIS, ROUTINE W REFLEX MICROSCOPIC - Abnormal; Notable for the following:    APPearance CLOUDY (*)    Hgb urine dipstick MODERATE (*)    Leukocytes, UA LARGE (*)    All other components within normal limits  URINE MICROSCOPIC-ADD ON - Abnormal; Notable for the following:    Squamous Epithelial / LPF MANY (*)    Bacteria, UA MANY (*)    All other components within normal limits  URINE CULTURE  PREGNANCY, URINE    Imaging Review No results found.   EKG Interpretation None      MDM   Final diagnoses:  Acute cystitis with hematuria    Uncomplicated urinary tract infection.  Home with Pyridium and Keflex.    Hoy Morn, MD 01/05/14 2051

## 2014-01-07 LAB — URINE CULTURE: Colony Count: >=100000

## 2014-01-08 ENCOUNTER — Telehealth (HOSPITAL_BASED_OUTPATIENT_CLINIC_OR_DEPARTMENT_OTHER): Payer: Self-pay | Admitting: Emergency Medicine

## 2014-01-08 NOTE — Telephone Encounter (Signed)
Post ED Visit - Positive Culture Follow-up  Culture report reviewed by antimicrobial stewardship pharmacist: []  Wes [place], Pharm.D., BCPS []  Heide Guile, Pharm.D., BCPS []  Alycia Rossetti, Pharm.D., BCPS [x]  Morrison, Pharm.D., BCPS, AAHIVP []  Legrand Como, Pharm.D., BCPS, AAHIVP []  Juliene Pina, Pharm.D.  Positive urine culture Treated with cephalexin, organism sensitive to the same and no further patient follow-up is required at this time.  Ernesta Amble 01/08/2014, 12:36 PM

## 2015-02-01 ENCOUNTER — Emergency Department (HOSPITAL_COMMUNITY): Payer: BC Managed Care – PPO

## 2015-02-01 ENCOUNTER — Emergency Department (HOSPITAL_COMMUNITY)
Admission: EM | Admit: 2015-02-01 | Discharge: 2015-02-01 | Disposition: A | Discharge status: Home / Self Care | Payer: BC Managed Care – PPO | Attending: Emergency Medicine | Admitting: Emergency Medicine

## 2015-02-01 ENCOUNTER — Encounter (HOSPITAL_COMMUNITY): Payer: Self-pay | Admitting: Physical Medicine and Rehabilitation

## 2015-02-01 DIAGNOSIS — R509 Fever, unspecified: Secondary | ICD-10-CM | POA: Diagnosis not present

## 2015-02-01 DIAGNOSIS — Z79899 Other long term (current) drug therapy: Secondary | ICD-10-CM | POA: Insufficient documentation

## 2015-02-01 DIAGNOSIS — R05 Cough: Secondary | ICD-10-CM | POA: Insufficient documentation

## 2015-02-01 DIAGNOSIS — Z792 Long term (current) use of antibiotics: Secondary | ICD-10-CM | POA: Insufficient documentation

## 2015-02-01 DIAGNOSIS — R5383 Other fatigue: Secondary | ICD-10-CM | POA: Insufficient documentation

## 2015-02-01 DIAGNOSIS — R059 Cough, unspecified: Secondary | ICD-10-CM

## 2015-02-01 DIAGNOSIS — R110 Nausea: Secondary | ICD-10-CM | POA: Diagnosis not present

## 2015-02-01 DIAGNOSIS — R51 Headache: Secondary | ICD-10-CM | POA: Insufficient documentation

## 2015-02-01 MED ORDER — KETOROLAC TROMETHAMINE 60 MG/2ML IM SOLN
60.0000 mg | Freq: Once | INTRAMUSCULAR | Status: AC
  Administered 2015-02-01: 60 mg via INTRAMUSCULAR
  Filled 2015-02-01: qty 2

## 2015-02-01 MED ORDER — ACETAMINOPHEN 325 MG PO TABS
650.0000 mg | ORAL_TABLET | Freq: Once | ORAL | Status: AC
  Administered 2015-02-01: 650 mg via ORAL

## 2015-02-01 MED ORDER — ACETAMINOPHEN 325 MG PO TABS
ORAL_TABLET | ORAL | Status: AC
  Filled 2015-02-01: qty 2

## 2015-02-01 NOTE — ED Notes (Signed)
Pt transported to xray 

## 2015-02-01 NOTE — ED Notes (Addendum)
Pt reports fever, cough, headache and fatigue. States she hasn't felt well for several days. Respirations unlabored. Pt is alert and oriented x4.

## 2015-02-01 NOTE — Discharge Instructions (Signed)
Cough, Adult  A cough is a reflex that helps clear your throat and airways. It can help heal the body or may be a reaction to an irritated airway. A cough may only last 2 or 3 weeks (acute) or may last more than 8 weeks (chronic).  CAUSES Acute cough:  Viral or bacterial infections. Chronic cough:  Infections.  Allergies.  Asthma.  Post-nasal drip.  Smoking.  Heartburn or acid reflux.  Some medicines.  Chronic lung problems (COPD).  Cancer. SYMPTOMS   Cough.  Fever.  Chest pain.  Increased breathing rate.  High-pitched whistling sound when breathing (wheezing).  Colored mucus that you cough up (sputum). TREATMENT   A bacterial cough may be treated with antibiotic medicine.  A viral cough must run its course and will not respond to antibiotics.  Your caregiver may recommend other treatments if you have a chronic cough. HOME CARE INSTRUCTIONS   Only take over-the-counter or prescription medicines for pain, discomfort, or fever as directed by your caregiver. Use cough suppressants only as directed by your caregiver.  Use a cold steam vaporizer or humidifier in your bedroom or home to help loosen secretions.  Sleep in a semi-upright position if your cough is worse at night.  Rest as needed.  Stop smoking if you smoke. SEEK IMMEDIATE MEDICAL CARE IF:   You have pus in your sputum.  Your cough starts to worsen.  You cannot control your cough with suppressants and are losing sleep.  You begin coughing up blood.  You have difficulty breathing.  You develop pain which is getting worse or is uncontrolled with medicine.  You have a fever. MAKE SURE YOU:   Understand these instructions.  Will watch your condition.  Will get help right away if you are not doing well or get worse. Document Released: 12/29/2010 Document Revised: 09/24/2011 Document Reviewed: 12/29/2010 [facility] Patient Information 2015 [place], [place]. This information is not intended  to replace advice given to you by your health care provider. Make sure you discuss any questions you have with your health care provider.  Fever, Adult A fever is a higher than normal body temperature. In an adult, an oral temperature around 98.6 F (37 C) is considered normal. A temperature of 100.4 F (38 C) or higher is generally considered a fever. Mild or moderate fevers generally have no long-term effects and often do not require treatment. Extreme fever (greater than or equal to 106 F or 41.1 C) can cause seizures. The sweating that may occur with repeated or prolonged fever may cause dehydration. Elderly people can develop confusion during a fever. A measured temperature can vary with:  Age.  Time of day.  Method of measurement (mouth, underarm, rectal, or ear). The fever is confirmed by taking a temperature with a thermometer. Temperatures can be taken different ways. Some methods are accurate and some are not.  An oral temperature is used most commonly. Electronic thermometers are fast and accurate.  An ear temperature will only be accurate if the thermometer is positioned as recommended by the manufacturer.  A rectal temperature is accurate and done for those adults who have a condition where an oral temperature cannot be taken.  An underarm (axillary) temperature is not accurate and not recommended. Fever is a symptom, not a disease.  CAUSES   Infections commonly cause fever.  Some noninfectious causes for fever include:  Some arthritis conditions.  Some thyroid or adrenal gland conditions.  Some immune system conditions.  Some types of cancer.  A medicine reaction.  High doses of certain street drugs such as methamphetamine.  Dehydration.  Exposure to high outside or room temperatures.  Occasionally, the source of a fever cannot be determined. This is sometimes called a "fever of unknown origin" (FUO).  Some situations may lead to a temporary rise in body  temperature that may go away on its own. Examples are:  Childbirth.  Surgery.  Intense exercise. HOME CARE INSTRUCTIONS   Take appropriate medicines for fever. Follow dosing instructions carefully. If you use acetaminophen to reduce the fever, be careful to avoid taking other medicines that also contain acetaminophen. Do not take aspirin for a fever if you are younger than age 66. There is an association with Reye's syndrome. Reye's syndrome is a rare but potentially deadly disease.  If an infection is present and antibiotics have been prescribed, take them as directed. Finish them even if you start to feel better.  Rest as needed.  Maintain an adequate fluid intake. To prevent dehydration during an illness with prolonged or recurrent fever, you may need to drink extra fluid.Drink enough fluids to keep your urine clear or pale yellow.  Sponging or bathing with room temperature water may help reduce body temperature. Do not use ice water or alcohol sponge baths.  Dress comfortably, but do not over-bundle. SEEK MEDICAL CARE IF:   You are unable to keep fluids down.  You develop vomiting or diarrhea.  You are not feeling at least partly better after 3 days.  You develop new symptoms or problems. SEEK IMMEDIATE MEDICAL CARE IF:   You have shortness of breath or trouble breathing.  You develop excessive weakness.  You are dizzy or you faint.  You are extremely thirsty or you are making little or no urine.  You develop new pain that was not there before (such as in the head, neck, chest, back, or abdomen).  You have persistent vomiting and diarrhea for more than 1 to 2 days.  You develop a stiff neck or your eyes become sensitive to light.  You develop a skin rash.  You have a fever or persistent symptoms for more than 2 to 3 days.  You have a fever and your symptoms suddenly get worse. MAKE SURE YOU:   Understand these instructions.  Will watch your  condition.  Will get help right away if you are not doing well or get worse. Document Released: 12/26/2000 Document Revised: 11/16/2013 Document Reviewed: 05/03/2011 [facility] Patient Information 2015 [place], [place]. This information is not intended to replace advice given to you by your health care provider. Make sure you discuss any questions you have with your health care provider.

## 2015-02-01 NOTE — ED Provider Notes (Signed)
CSN: [id number]     Arrival date & time 02/01/15  1656 History  This chart was scribed for Comer Locket, PA-C, working with Pattricia Boss, MD by Steva Colder, ED Scribe. The patient was seen in room TR10C/TR10C at 6:22 PM.    Chief Complaint  Patient presents with  . Fever  . Cough  . Fatigue      No language interpreter was used.    HPI Comments: Ashley Li is a 34 y.o. female who presents to the Emergency Department complaining of fever onset 2 nights ago and worsening yesterday. Pt reports that her HA is the worst pain and she rates it 10/10. She states that she is having associated symptoms of cough, fatigue, HA, and nausea. She states that she has not tried any medications for the current problem. She denies congestion, rhinorrhea, vomiting, SOB, chest pain, hemoptysis, rash, neck pain, neck stiffness, and any other symptoms. Denies sick contacts. Denies recent travel or tick bites.   History reviewed. No pertinent past medical history. History reviewed. No pertinent past surgical history. No family history on file. History  Substance Use Topics  . Smoking status: Never Smoker   . Smokeless tobacco: Never Used  . Alcohol Use: Yes     Comment: socially    OB History    No data available     Review of Systems  Constitutional: Positive for fever and fatigue. Negative for chills.  HENT: Negative for congestion and rhinorrhea.   Respiratory: Positive for cough. Negative for shortness of breath.   Gastrointestinal: Positive for nausea. Negative for vomiting and abdominal pain.  Genitourinary: Negative for vaginal bleeding, vaginal discharge, difficulty urinating, vaginal pain and pelvic pain.  Musculoskeletal: Negative for neck pain and neck stiffness.  Skin: Negative for rash.  Neurological: Positive for headaches.      Allergies  Review of patient's allergies indicates no known allergies.  Home Medications   Prior to Admission medications   Medication Sig  Start Date End Date Taking? Authorizing Provider  cephALEXin (KEFLEX) 500 MG capsule Take 1 capsule (500 mg total) by mouth 3 (three) times daily. 01/05/14   Jola Schmidt, MD  etonogestrel (IMPLANON) 68 MG IMPL implant Inject 1 each into the skin once.    Historical Provider, MD  phenazopyridine (PYRIDIUM) 200 MG tablet Take 1 tablet (200 mg total) by mouth 3 (three) times daily with meals. 01/05/14   Jola Schmidt, MD   BP 107/68 mmHg  Pulse 88  Temp(Src) 99.6 F (37.6 C) (Oral)  Resp 18  SpO2 100% Physical Exam  Constitutional: She is oriented to person, place, and time. She appears well-developed and well-nourished. No distress.  HENT:  Head: Normocephalic and atraumatic.  Mouth/Throat: Uvula is midline, oropharynx is clear and moist and mucous membranes are normal. No oropharyngeal exudate.  Eyes: Conjunctivae and EOM are normal. Pupils are equal, round, and reactive to light. Right eye exhibits no discharge. Left eye exhibits no discharge. No scleral icterus.  Neck: Normal range of motion. Neck supple. No tracheal deviation present.  No nuchal rigidity or meningismus  Cardiovascular: Normal rate, regular rhythm and normal heart sounds.  Exam reveals no gallop and no friction rub.   No murmur heard. Pulmonary/Chest: Effort normal and breath sounds normal. No respiratory distress. She has no wheezes. She has no rales.  Abdominal: Soft. She exhibits no distension and no mass. There is no tenderness. There is no rebound and no guarding.  Musculoskeletal: Normal range of motion. She exhibits no edema  or tenderness.  Neurological: She is alert and oriented to person, place, and time.  Cranial Nerves II-XII grossly intact  Skin: Skin is warm and dry. No rash noted.  Psychiatric: She has a normal mood and affect. Her behavior is normal.  Nursing note and vitals reviewed.   ED Course  Procedures (including critical care time) DIAGNOSTIC STUDIES: Oxygen Saturation is 99% on RA, nl by my  interpretation.    COORDINATION OF CARE: 6:26 PM-Discussed treatment plan with pt at bedside and pt agreed to plan.   Labs Review Labs Reviewed - No data to display  Imaging Review Dg Chest 2 View  02/01/2015   CLINICAL DATA:  Fever and cough with vomiting for 2 days.  EXAM: CHEST  2 VIEW  COMPARISON:  None.  FINDINGS: Midline trachea.  Normal heart size and mediastinal contours.  Sharp costophrenic angles.  No pneumothorax.  Clear lungs.  IMPRESSION: No active cardiopulmonary disease.   Electronically Signed   By: Abigail Miyamoto M.D.   On: 02/01/2015 18:15     EKG Interpretation None     Meds given in ED:  Medications  acetaminophen (TYLENOL) tablet 650 mg (650 mg Oral Given 02/01/15 1725)  ketorolac (TORADOL) injection 60 mg (60 mg Intramuscular Given 02/01/15 1828)    Discharge Medication List as of 02/01/2015  7:23 PM     Filed Vitals:   02/01/15 1708 02/01/15 1920  BP: 129/68 107/68  Pulse: 103 88  Temp: 102.6 F (39.2 C) 99.6 F (37.6 C)  TempSrc: Oral Oral  Resp: 18 18  SpO2: 99% 100%    MDM  Vitals stable - WNL -afebrile Pt resting comfortably in ED. PE--normal lung exam. No neck stiffness or rigidity. No meningismus. Benign abdominal exam. Grossly benign physical exam. Imaging--chest x-ray shows no acute cardiopulmonary pathology. I personally reviewed the imaging and agree with the results as interpreted by the radiologist.   DDX--low suspicion for meningitis, overt pneumonia. No chest pain, dyspnea or hypoxia; low suspicion for PE.  Patient likely suffering from viral syndrome. Discussed symptomatic support at home and alternating between Tylenol and Motrin for fevers and pain. No evidence of other acute or emergent pathology. Strict return precautions given. No evidence of other acute or emergent pathology. I discussed all relevant lab findings and imaging results with pt and they verbalized understanding. Discussed f/u with PCP within 48 hrs and return  precautions, pt very amenable to plan.  Final diagnoses:  Fever, unspecified fever cause  Cough    I personally performed the services described in this documentation, which was scribed in my presence. The recorded information has been reviewed and is accurate.    Comer Locket, PA-C 02/02/15 0139  Pattricia Boss, MD 02/03/15 [phone]

## 2015-08-11 ENCOUNTER — Encounter (HOSPITAL_COMMUNITY): Payer: Self-pay | Admitting: Emergency Medicine

## 2015-08-11 ENCOUNTER — Emergency Department (HOSPITAL_COMMUNITY): Payer: BC Managed Care – PPO

## 2015-08-11 ENCOUNTER — Emergency Department (HOSPITAL_COMMUNITY)
Admission: EM | Admit: 2015-08-11 | Discharge: 2015-08-12 | Disposition: A | Discharge status: Home / Self Care | Payer: BC Managed Care – PPO | Attending: Emergency Medicine | Admitting: Emergency Medicine

## 2015-08-11 DIAGNOSIS — M25512 Pain in left shoulder: Secondary | ICD-10-CM | POA: Insufficient documentation

## 2015-08-11 DIAGNOSIS — Z793 Long term (current) use of hormonal contraceptives: Secondary | ICD-10-CM | POA: Diagnosis not present

## 2015-08-11 DIAGNOSIS — R079 Chest pain, unspecified: Secondary | ICD-10-CM | POA: Diagnosis present

## 2015-08-11 DIAGNOSIS — R200 Anesthesia of skin: Secondary | ICD-10-CM | POA: Insufficient documentation

## 2015-08-11 DIAGNOSIS — Z87891 Personal history of nicotine dependence: Secondary | ICD-10-CM | POA: Insufficient documentation

## 2015-08-11 DIAGNOSIS — M7542 Impingement syndrome of left shoulder: Secondary | ICD-10-CM | POA: Diagnosis not present

## 2015-08-11 DIAGNOSIS — Z7982 Long term (current) use of aspirin: Secondary | ICD-10-CM | POA: Insufficient documentation

## 2015-08-11 NOTE — ED Notes (Signed)
Pt c/o left sided sharp chest pain and hand numbness and tingling that started today. Pt c/o SOB, denies n/v.

## 2015-08-12 LAB — CBC
HEMATOCRIT: 47.5 % — AB (ref 36.0–46.0)
Hemoglobin: 16.4 g/dL — ABNORMAL HIGH (ref 12.0–15.0)
MCH: 29.7 pg (ref 26.0–34.0)
MCHC: 34.5 g/dL (ref 30.0–36.0)
MCV: 86.1 fL (ref 78.0–100.0)
Platelets: 366 10*3/uL (ref 150–400)
RBC: 5.52 MIL/uL — ABNORMAL HIGH (ref 3.87–5.11)
RDW: 12.9 % (ref 11.5–15.5)
WBC: 7.2 10*3/uL (ref 4.0–10.5)

## 2015-08-12 LAB — COMPREHENSIVE METABOLIC PANEL
ALT: 15 U/L (ref 14–54)
ANION GAP: 9 (ref 5–15)
AST: 20 U/L (ref 15–41)
Albumin: 3.4 g/dL — ABNORMAL LOW (ref 3.5–5.0)
Alkaline Phosphatase: 45 U/L (ref 38–126)
BILIRUBIN TOTAL: 0.6 mg/dL (ref 0.3–1.2)
BUN: 15 mg/dL (ref 6–20)
CHLORIDE: 112 mmol/L — AB (ref 101–111)
CO2: 21 mmol/L — ABNORMAL LOW (ref 22–32)
Calcium: 9.3 mg/dL (ref 8.9–10.3)
Creatinine, Ser: 1.14 mg/dL — ABNORMAL HIGH (ref 0.44–1.00)
Glucose, Bld: 100 mg/dL — ABNORMAL HIGH (ref 65–99)
Potassium: 4.2 mmol/L (ref 3.5–5.1)
Sodium: 142 mmol/L (ref 135–145)
TOTAL PROTEIN: 6 g/dL — AB (ref 6.5–8.1)

## 2015-08-12 LAB — URINALYSIS, ROUTINE W REFLEX MICROSCOPIC
Bilirubin Urine: NEGATIVE
Glucose, UA: NEGATIVE mg/dL
Hgb urine dipstick: NEGATIVE
Ketones, ur: NEGATIVE mg/dL
LEUKOCYTES UA: NEGATIVE
NITRITE: NEGATIVE
PH: 5 (ref 5.0–8.0)
Protein, ur: NEGATIVE mg/dL
SPECIFIC GRAVITY, URINE: 1.023 (ref 1.005–1.030)

## 2015-08-12 LAB — I-STAT TROPONIN, ED: Troponin i, poc: 0 ng/mL (ref 0.00–0.08)

## 2015-08-12 MED ORDER — IBUPROFEN 800 MG PO TABS
800.0000 mg | ORAL_TABLET | Freq: Once | ORAL | Status: AC
  Administered 2015-08-12: 800 mg via ORAL
  Filled 2015-08-12: qty 1

## 2015-08-12 MED ORDER — NAPROXEN 500 MG PO TABS: 500.0000 mg | ORAL_TABLET | Freq: Two times a day (BID) | ORAL | Status: DC

## 2015-08-12 MED ORDER — OXYCODONE-ACETAMINOPHEN 5-325 MG PO TABS: 1.0000 | ORAL_TABLET | ORAL | Status: DC | PRN

## 2015-08-12 NOTE — ED Notes (Signed)
MD at bedside. 

## 2015-08-12 NOTE — ED Provider Notes (Signed)
CSN: [id number]     Arrival date & time 08/11/15  2343 History  By signing my name below, I, Ashley Li, attest that this documentation has been prepared under the direction and in the presence of Delora Fuel, MD. Electronically Signed: Meriel Li, ED Scribe. 08/12/2015. 1:16 AM.   Chief Complaint  Patient presents with  . Chest Pain  . Numbness   The history is provided by the patient. No language interpreter was used.   HPI Comments: Ashley Li is a 35 y.o. female, with no chronic medical conditions, who presents to the Emergency Department complaining of sudden onset, constant, 8/10 pain that radiates from left scapula to left upper chest, that is described as sharp and is exacerbated with movement of left arm. The pt also describes the feeling of pressure and numbness in her left fingers. 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 She has taken tylenol and a muscle relaxer today without significant relief. She is right hand dominant. Pt denies SOB, nausea, diaphoresis, or prior complications with left shoulder, arm, or hand. No h/o tobacco abuse, EtOH abuse or PMhx of DM, HTN, or HLD.   History reviewed. No pertinent past medical history. History reviewed. No pertinent past surgical history. History reviewed. No pertinent family history. Social History  Substance Use Topics  . Smoking status: Never Smoker   . Smokeless tobacco: Never Used  . Alcohol Use: Yes     Comment: socially    OB History    No data available     Review of Systems  Constitutional: Negative for diaphoresis.  Respiratory: Negative for shortness of breath.   Cardiovascular: Positive for chest pain.  Gastrointestinal: Negative for nausea.  Musculoskeletal: Positive for arthralgias (left shoulder).  Neurological: Positive for numbness ( left fingers).  All other systems reviewed and are negative.  Allergies  Review of patient's  allergies indicates no known allergies.  Home Medications   Prior to Admission medications   Medication Sig Start Date End Date Taking? Authorizing Provider  aspirin 325 MG tablet Take 325 mg by mouth once.   Yes Historical Provider, MD  medroxyPROGESTERone (DEPO-PROVERA) 150 MG/ML injection Inject 150 mg into the muscle every 3 (three) months.   Yes Historical Provider, MD  naproxen (NAPROSYN) 500 MG tablet Take 1 tablet (500 mg total) by mouth 2 (two) times daily. [id number]   Delora Fuel, MD  oxyCODONE-acetaminophen (PERCOCET) 5-325 MG tablet Take 1 tablet by mouth every 4 (four) hours as needed for moderate pain. [id number]   Delora Fuel, MD   BP [id number] mmHg  Pulse 106  Temp(Src) 98.2 F (36.8 C) (Oral)  Resp 18  SpO2 98% Physical Exam  Constitutional: She is oriented to person, place, and time. She appears well-developed and well-nourished.  HENT:  Head: Normocephalic and atraumatic.  Eyes: EOM are normal. Pupils are equal, round, and reactive to light.  Neck: Normal range of motion. Neck supple. No JVD present.  Cardiovascular: Normal rate, regular rhythm and normal heart sounds.   No murmur heard. Pulmonary/Chest: Effort normal and breath sounds normal. She has no wheezes. She has no rales. She exhibits no tenderness.  Abdominal: Soft. Bowel sounds are normal. She exhibits no distension and no mass. There is no tenderness.  Musculoskeletal: Normal range of motion. She exhibits no edema.  Tender left shoulder and anterior deltoid groove, rotator cuff impingement signs present, normal sensation, slight decreased pincer strength in left hand.  Lymphadenopathy:    She has no cervical adenopathy.  Neurological: She is alert and oriented to person, place, and time. No cranial nerve deficit. She exhibits normal muscle tone. Coordination normal.  Skin: Skin is warm and dry. No rash noted.  Psychiatric: She has a normal mood and affect. Her behavior is normal. Judgment and thought content  normal.  Nursing note and vitals reviewed.   ED Course  Procedures  DIAGNOSTIC STUDIES: Oxygen Saturation is 98% on RA, normal by my interpretation.    COORDINATION OF CARE: 1:08 AM Discussed unremarkable cardiac workup with pt. Discussed treatment plan which includes to order ibuprofen as pt is driving herself home and prescribe naprosyn and a short course of pain medication with pt. Pt acknowledges and agrees to plan.   Labs Review Results for orders placed or performed during the hospital encounter of 08/11/15  CBC  Result Value Ref Range   WBC 7.2 4.0 - 10.5 K/uL   RBC 5.52 (H) 3.87 - 5.11 MIL/uL   Hemoglobin 16.4 (H) 12.0 - 15.0 g/dL   HCT 47.5 (H) 36.0 - 46.0 %   MCV 86.1 78.0 - 100.0 fL   MCH 29.7 26.0 - 34.0 pg   MCHC 34.5 30.0 - 36.0 g/dL   RDW 12.9 11.5 - 15.5 %   Platelets 366 150 - 400 K/uL  Comprehensive metabolic panel  Result Value Ref Range   Sodium 142 135 - 145 mmol/L   Potassium 4.2 3.5 - 5.1 mmol/L   Chloride 112 (H) 101 - 111 mmol/L   CO2 21 (L) 22 - 32 mmol/L   Glucose, Bld 100 (H) 65 - 99 mg/dL   BUN 15 6 - 20 mg/dL   Creatinine, Ser 1.14 (H) 0.44 - 1.00 mg/dL   Calcium 9.3 8.9 - 10.3 mg/dL   Total Protein 6.0 (L) 6.5 - 8.1 g/dL   Albumin 3.4 (L) 3.5 - 5.0 g/dL   AST 20 15 - 41 U/L   ALT 15 14 - 54 U/L   Alkaline Phosphatase 45 38 - 126 U/L   Total Bilirubin 0.6 0.3 - 1.2 mg/dL   GFR calc non Af Amer >60 >60 mL/min   GFR calc Af Amer >60 >60 mL/min   Anion gap 9 5 - 15  Urinalysis, Routine w reflex microscopic (not at [facility])  Result Value Ref Range   Color, Urine YELLOW YELLOW   APPearance CLEAR CLEAR   Specific Gravity, Urine 1.023 1.005 - 1.030   pH 5.0 5.0 - 8.0   Glucose, UA NEGATIVE NEGATIVE mg/dL   Hgb urine dipstick NEGATIVE NEGATIVE   Bilirubin Urine NEGATIVE NEGATIVE   Ketones, ur NEGATIVE NEGATIVE mg/dL   Protein, ur NEGATIVE NEGATIVE mg/dL   Nitrite NEGATIVE NEGATIVE   Leukocytes, UA NEGATIVE NEGATIVE  I-stat troponin, ED (not  at [facility])  Result Value Ref Range   Troponin i, poc 0.00 0.00 - 0.08 ng/mL   Comment 3            Imaging Review Dg Chest 2 View  08/12/2015  CLINICAL DATA:  Acute onset of left-sided chest pain numbness, radiating down the left arm to the hand. Shortness of breath. Initial encounter. EXAM: CHEST  2 VIEW COMPARISON:  Chest radiograph performed 02/01/2015 FINDINGS: The lungs are well-aerated and clear. There is no evidence of focal opacification, pleural effusion or pneumothorax. The heart is normal in size; the mediastinal contour is within normal limits. No acute osseous abnormalities are seen. IMPRESSION: No acute cardiopulmonary process seen.  Electronically Signed   By: Garald Balding M.D.   On: 08/12/2015 00:29   I have personally reviewed and evaluated these images and lab results as part of my medical decision-making.   EKG Interpretation   Date/Time:  Thursday August 11 2015 23:54:26 EST Ventricular Rate:  99 PR Interval:  124 QRS Duration: 68 QT Interval:  331 QTC Calculation: 425 R Axis:   39 Text Interpretation:  Sinus rhythm Normal ECG When compared with ECG of  03/05/2007, No significant change was found Confirmed by [facility]  MD, Lucrecia Mcphearson  ([id number]) on 08/12/2015 12:06:46 AM      MDM   Final diagnoses:  Pain in left shoulder    Left shoulder pain with numbness in her left hand. Numbness is in a pattern suggestive of median nerve palsy in the wrist slightly decreased pincer grasp strength. However, no other signs of carpal tunnel syndrome present. There is evidence of rotator cuff injury with impingement signs present and tenderness and I suspect this is the source of both her pain and numbness. She is discharged with prescription for naproxen and oxycodone have acetaminophen and is referred to orthopedics for follow-up.  I personally performed the services described in this documentation, which was scribed in my presence. The recorded information has been reviewed and is  accurate.      Delora Fuel, MD [id number]

## 2015-08-12 NOTE — Discharge Instructions (Signed)
Shoulder Pain The shoulder is the joint that connects your arms to your body. The bones that form the shoulder joint include the upper arm bone (humerus), the shoulder blade (scapula), and the collarbone (clavicle). The top of the humerus is shaped like a ball and fits into a rather flat socket on the scapula (glenoid cavity). A combination of muscles and strong, fibrous tissues that connect muscles to bones (tendons) support your shoulder joint and hold the ball in the socket. Small, fluid-filled sacs (bursae) are located in different areas of the joint. They act as cushions between the bones and the overlying soft tissues and help reduce friction between the gliding tendons and the bone as you move your arm. Your shoulder joint allows a wide range of motion in your arm. This range of motion allows you to do things like scratch your back or throw a ball. However, this range of motion also makes your shoulder more prone to pain from overuse and injury. Causes of shoulder pain can originate from both injury and overuse and usually can be grouped in the following four categories:  Redness, swelling, and pain (inflammation) of the tendon (tendinitis) or the bursae (bursitis).  Instability, such as a dislocation of the joint.  Inflammation of the joint (arthritis).  Broken bone (fracture). HOME CARE INSTRUCTIONS   Apply ice to the sore area.  Put ice in a plastic bag.  Place a towel between your skin and the bag.  Leave the ice on for 15-20 minutes, 3-4 times per day for the first 2 days, or as directed by your health care provider.  Stop using cold packs if they do not help with the pain.  If you have a shoulder sling or immobilizer, wear it as long as your caregiver instructs. Only remove it to shower or bathe. Move your arm as little as possible, but keep your hand moving to prevent swelling.  Squeeze a soft ball or foam pad as much as possible to help prevent swelling.  Only take  over-the-counter or prescription medicines for pain, discomfort, or fever as directed by your caregiver. SEEK MEDICAL CARE IF:   Your shoulder pain increases, or new pain develops in your arm, hand, or fingers.  Your hand or fingers become cold and numb.  Your pain is not relieved with medicines. SEEK IMMEDIATE MEDICAL CARE IF:   Your arm, hand, or fingers are numb or tingling.  Your arm, hand, or fingers are significantly swollen or turn white or blue. MAKE SURE YOU:   Understand these instructions.  Will watch your condition.  Will get help right away if you are not doing well or get worse.   This information is not intended to replace advice given to you by your health care provider. Make sure you discuss any questions you have with your health care provider.   Document Released: 04/11/2005 Document Revised: 07/23/2014 Document Reviewed: 10/25/2014 Elsevier Interactive Patient Education 2016 [employer]  Naproxen and naproxen sodium oral immediate-release tablets What is this medicine? NAPROXEN (na PROX en) is a non-steroidal anti-inflammatory drug (NSAID). It is used to reduce swelling and to treat pain. This medicine may be used for dental pain, headache, or painful monthly periods. It is also used for painful joint and muscular problems such as arthritis, tendinitis, bursitis, and gout. This medicine may be used for other purposes; ask your health care provider or pharmacist if you have questions. What should I tell my health care provider before I take this medicine? They  need to know if you have any of these conditions: -asthma -cigarette smoker -drink more than 3 alcohol containing drinks a day -heart disease or circulation problems such as heart failure or leg edema (fluid retention) -high blood pressure -kidney disease -liver disease -stomach bleeding or ulcers -an unusual or allergic reaction to naproxen, aspirin, other NSAIDs, other medicines, foods, dyes, or  preservatives -pregnant or trying to get pregnant -breast-feeding How should I use this medicine? Take this medicine by mouth with a glass of water. Follow the directions on the prescription label. Take it with food if your stomach gets upset. Try to not lie down for at least 10 minutes after you take it. Take your medicine at regular intervals. Do not take your medicine more often than directed. Long-term, continuous use may increase the risk of heart attack or stroke. A special MedGuide will be given to you by the pharmacist with each prescription and refill. Be sure to read this information carefully each time. Talk to your pediatrician regarding the use of this medicine in children. Special care may be needed. Overdosage: If you think you have taken too much of this medicine contact a poison control center or emergency room at once. NOTE: This medicine is only for you. Do not share this medicine with others. What if I miss a dose? If you miss a dose, take it as soon as you can. If it is almost time for your next dose, take only that dose. Do not take double or extra doses. What may interact with this medicine? -alcohol -aspirin -cidofovir -diuretics -lithium -methotrexate -other drugs for inflammation like ketorolac or prednisone -pemetrexed -probenecid -warfarin This list may not describe all possible interactions. Give your health care provider a list of all the medicines, herbs, non-prescription drugs, or dietary supplements you use. Also tell them if you smoke, drink alcohol, or use illegal drugs. Some items may interact with your medicine. What should I watch for while using this medicine? Tell your doctor or health care professional if your pain does not get better. Talk to your doctor before taking another medicine for pain. Do not treat yourself. This medicine does not prevent heart attack or stroke. In fact, this medicine may increase the chance of a heart attack or stroke. The  chance may increase with longer use of this medicine and in people who have heart disease. If you take aspirin to prevent heart attack or stroke, talk with your doctor or health care professional. Do not take other medicines that contain aspirin, ibuprofen, or naproxen with this medicine. Side effects such as stomach upset, nausea, or ulcers may be more likely to occur. Many medicines available without a prescription should not be taken with this medicine. This medicine can cause ulcers and bleeding in the stomach and intestines at any time during treatment. Do not smoke cigarettes or drink alcohol. These increase irritation to your stomach and can make it more susceptible to damage from this medicine. Ulcers and bleeding can happen without warning symptoms and can cause death. You may get drowsy or dizzy. Do not drive, use machinery, or do anything that needs mental alertness until you know how this medicine affects you. Do not stand or sit up quickly, especially if you are an older patient. This reduces the risk of dizzy or fainting spells. This medicine can cause you to bleed more easily. Try to avoid damage to your teeth and gums when you brush or floss your teeth. What side effects may I notice  from receiving this medicine? Side effects that you should report to your doctor or health care professional as soon as possible: -black or bloody stools, blood in the urine or vomit -blurred vision -chest pain -difficulty breathing or wheezing -nausea or vomiting -severe stomach pain -skin rash, skin redness, blistering or peeling skin, hives, or itching -slurred speech or weakness on one side of the body -swelling of eyelids, throat, lips -unexplained weight gain or swelling -unusually weak or tired -yellowing of eyes or skin Side effects that usually do not require medical attention (report to your doctor or health care professional if they continue or are  bothersome): -constipation -headache -heartburn This list may not describe all possible side effects. Call your doctor for medical advice about side effects. You may report side effects to FDA at 1-800-FDA-1088. Where should I keep my medicine? Keep out of the reach of children. Store at room temperature between 15 and 30 degrees C (59 and 86 degrees F). Keep container tightly closed. Throw away any unused medicine after the expiration date. NOTE: This sheet is a summary. It may not cover all possible information. If you have questions about this medicine, talk to your doctor, pharmacist, or health care provider.    2016, Elsevier/Gold Standard. (2009-07-04 20:10:16)  Acetaminophen; Oxycodone tablets What is this medicine? ACETAMINOPHEN; OXYCODONE (a set a MEE noe fen; ox i KOE done) is a pain reliever. It is used to treat moderate to severe pain. This medicine may be used for other purposes; ask your health care provider or pharmacist if you have questions. What should I tell my health care provider before I take this medicine? They need to know if you have any of these conditions: -brain tumor -Crohn's disease, inflammatory bowel disease, or ulcerative colitis -drug abuse or addiction -head injury -heart or circulation problems -if you often drink alcohol -kidney disease or problems going to the bathroom -liver disease -lung disease, asthma, or breathing problems -an unusual or allergic reaction to acetaminophen, oxycodone, other opioid analgesics, other medicines, foods, dyes, or preservatives -pregnant or trying to get pregnant -breast-feeding How should I use this medicine? Take this medicine by mouth with a full glass of water. Follow the directions on the prescription label. You can take it with or without food. If it upsets your stomach, take it with food. Take your medicine at regular intervals. Do not take it more often than directed. Talk to your pediatrician regarding the  use of this medicine in children. Special care may be needed. Patients over 109 years old may have a stronger reaction and need a smaller dose. Overdosage: If you think you have taken too much of this medicine contact a poison control center or emergency room at once. NOTE: This medicine is only for you. Do not share this medicine with others. What if I miss a dose? If you miss a dose, take it as soon as you can. If it is almost time for your next dose, take only that dose. Do not take double or extra doses. What may interact with this medicine? -alcohol -antihistamines -barbiturates like amobarbital, butalbital, butabarbital, methohexital, pentobarbital, phenobarbital, thiopental, and secobarbital -benztropine -drugs for bladder problems like solifenacin, trospium, oxybutynin, tolterodine, hyoscyamine, and methscopolamine -drugs for breathing problems like ipratropium and tiotropium -drugs for certain stomach or intestine problems like propantheline, homatropine methylbromide, glycopyrrolate, atropine, belladonna, and dicyclomine -general anesthetics like etomidate, ketamine, nitrous oxide, propofol, desflurane, enflurane, halothane, isoflurane, and sevoflurane -medicines for depression, anxiety, or psychotic disturbances -medicines for sleep -  muscle relaxants -naltrexone -narcotic medicines (opiates) for pain -phenothiazines like perphenazine, thioridazine, chlorpromazine, mesoridazine, fluphenazine, prochlorperazine, promazine, and trifluoperazine -scopolamine -tramadol -trihexyphenidyl This list may not describe all possible interactions. Give your health care provider a list of all the medicines, herbs, non-prescription drugs, or dietary supplements you use. Also tell them if you smoke, drink alcohol, or use illegal drugs. Some items may interact with your medicine. What should I watch for while using this medicine? Tell your doctor or health care professional if your pain does not go  away, if it gets worse, or if you have new or a different type of pain. You may develop tolerance to the medicine. Tolerance means that you will need a higher dose of the medication for pain relief. Tolerance is normal and is expected if you take this medicine for a long time. Do not suddenly stop taking your medicine because you may develop a severe reaction. Your body becomes used to the medicine. This does NOT mean you are addicted. Addiction is a behavior related to getting and using a drug for a non-medical reason. If you have pain, you have a medical reason to take pain medicine. Your doctor will tell you how much medicine to take. If your doctor wants you to stop the medicine, the dose will be slowly lowered over time to avoid any side effects. You may get drowsy or dizzy. Do not drive, use machinery, or do anything that needs mental alertness until you know how this medicine affects you. Do not stand or sit up quickly, especially if you are an older patient. This reduces the risk of dizzy or fainting spells. Alcohol may interfere with the effect of this medicine. Avoid alcoholic drinks. There are different types of narcotic medicines (opiates) for pain. If you take more than one type at the same time, you may have more side effects. Give your health care provider a list of all medicines you use. Your doctor will tell you how much medicine to take. Do not take more medicine than directed. Call emergency for help if you have problems breathing. The medicine will cause constipation. Try to have a bowel movement at least every 2 to 3 days. If you do not have a bowel movement for 3 days, call your doctor or health care professional. Do not take Tylenol (acetaminophen) or medicines that have acetaminophen with this medicine. Too much acetaminophen can be very dangerous. Many nonprescription medicines contain acetaminophen. Always read the labels carefully to avoid taking more acetaminophen. What side effects  may I notice from receiving this medicine? Side effects that you should report to your doctor or health care professional as soon as possible: -allergic reactions like skin rash, itching or hives, swelling of the face, lips, or tongue -breathing difficulties, wheezing -confusion -light headedness or fainting spells -severe stomach pain -unusually weak or tired -yellowing of the skin or the whites of the eyes Side effects that usually do not require medical attention (report to your doctor or health care professional if they continue or are bothersome): -dizziness -drowsiness -nausea -vomiting This list may not describe all possible side effects. Call your doctor for medical advice about side effects. You may report side effects to FDA at 1-800-FDA-1088. Where should I keep my medicine? Keep out of the reach of children. This medicine can be abused. Keep your medicine in a safe place to protect it from theft. Do not share this medicine with anyone. Selling or giving away this medicine is dangerous and against the  law. This medicine may cause accidental overdose and death if it taken by other adults, children, or pets. Mix any unused medicine with a substance like cat litter or coffee grounds. Then throw the medicine away in a sealed container like a sealed bag or a coffee can with a lid. Do not use the medicine after the expiration date. Store at room temperature between 20 and 25 degrees C (68 and 77 degrees F). NOTE: This sheet is a summary. It may not cover all possible information. If you have questions about this medicine, talk to your doctor, pharmacist, or health care provider.    2016, Elsevier/Gold Standard. (2014-06-02 15:18:46)

## 2015-08-19 ENCOUNTER — Ambulatory Visit (INDEPENDENT_AMBULATORY_CARE_PROVIDER_SITE_OTHER): Payer: BC Managed Care – PPO | Admitting: Internal Medicine

## 2015-08-19 ENCOUNTER — Ambulatory Visit (INDEPENDENT_AMBULATORY_CARE_PROVIDER_SITE_OTHER): Payer: BC Managed Care – PPO

## 2015-08-19 VITALS — BP 130/80 | HR 90 | Temp 98.3°F | Resp 20 | Ht 64.0 in | Wt 157.4 lb

## 2015-08-19 DIAGNOSIS — M5412 Radiculopathy, cervical region: Secondary | ICD-10-CM

## 2015-08-19 MED ORDER — CYCLOBENZAPRINE HCL 10 MG PO TABS: 10.0000 mg | ORAL_TABLET | Freq: Every day | ORAL | Status: DC

## 2015-08-19 MED ORDER — MELOXICAM 15 MG PO TABS: 15.0000 mg | ORAL_TABLET | Freq: Every day | ORAL | Status: DC

## 2015-08-19 MED ORDER — PREDNISONE 20 MG PO TABS: ORAL_TABLET | ORAL | Status: DC

## 2015-08-19 NOTE — Progress Notes (Addendum)
Subjective:  By signing my name below, I, Essence Howell, attest that this documentation has been prepared under the direction and in the presence of Leandrew Koyanagi, MD Electronically Signed: Ladene Artist, ED Scribe 08/19/2015 at 9:48 AM.   Patient ID: Ashley Li, female    DOB: Oct 08, 1980, 35 y.o.   MRN: [id number]  Chief Complaint  Patient presents with  . Shoulder Pain    left shoulder, swelling    HPI HPI Comments: Ashley Li is a 35 y.o. female who presents to the [facility] complaining of constant left shoulder pain onset 8 days ago. Pt was seen in the ED on 08/11/15 for the same and discharged with Naproxen and oxycodone. 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 She reports unchanged, aching left shoulder pain that radiates into her left fingers. Pain is worsened with bending her neck. She reports associated tingling in her left wrist and fingers. No known injury. Pt is right hand dominant.  History reviewed. No pertinent past medical history. Current Outpatient Prescriptions on File Prior to Visit  Medication Sig Dispense Refill  . medroxyPROGESTERone (DEPO-PROVERA) 150 MG/ML injection Inject 150 mg into the muscle every 3 (three) months.    . naproxen (NAPROSYN) 500 MG tablet Take 1 tablet (500 mg total) by mouth 2 (two) times daily. 30 tablet 0  . oxyCODONE-acetaminophen (PERCOCET) 5-325 MG tablet Take 1 tablet by mouth every 4 (four) hours as needed for moderate pain. 15 tablet 0  . aspirin 325 MG tablet Take 325 mg by mouth once. Reported on 08/19/2015     No current facility-administered medications on file prior to visit.   No Known Allergies [employer] [place]  Review of Systems  Musculoskeletal: Positive for arthralgias.      Objective:   Physical Exam  Constitutional: She is oriented to person, place, and time. She appears well-developed and well-nourished. No distress.  HENT:    Head: Normocephalic and atraumatic.  Eyes: Conjunctivae and EOM are normal.  Neck: Neck supple.  Pain radiating to L posterior shoulder with neck extension and flexion. Tenderness to palpation in L paracervical muscles and L trapezius. L shoulder has good ROM with pain only on ABduction against resistance. Slight decrease in sensation with tingling in fingers 1,2 and 3 on the L. Grip ok.  Cardiovascular: Normal rate.   Pulmonary/Chest: Effort normal. No respiratory distress.  Musculoskeletal: Normal range of motion.  Neurological: She is alert and oriented to person, place, and time.  Skin: Skin is warm and dry.  Psychiatric: She has a normal mood and affect. Her behavior is normal.  Nursing note and vitals reviewed.  [facility] reading (PRIMARY) by  Dr. Roby Donaway=[place] at 5/6.   Assessment & Plan:  Cervical radiculitis - Plan: DG Cervical Spine 2 or 3 views set up PT Meds ordered this encounter  Medications  . cyclobenzaprine (FLEXERIL) 10 MG tablet    Sig: Take 1 tablet (10 mg total) by mouth at bedtime.    Dispense:  30 tablet    Refill:  0  . meloxicam (MOBIC) 15 MG tablet    Sig: Take 1 tablet (15 mg total) by mouth daily.    Dispense:  30 tablet    Refill:  0  . predniSONE (DELTASONE) 20 MG tablet    Sig: 3/3/2/2/1/1 single daily dose for 6 days    Dispense:  12 tablet    Refill:  0  fu prim care here  I have completed the patient encounter in its entirety as documented by the scribe, with editing by me where necessary. Essie Lagunes P. Laney Pastor, M.D.

## 2015-08-19 NOTE — Patient Instructions (Signed)
NOTE:  Because you received an x-ray today, you will receive an invoice from [facility]. Please contact [facility] at [phone] with questions or concerns regarding your invoice. Our billing staff will not be able to assist you with those questions.  

## 2017-07-15 ENCOUNTER — Ambulatory Visit: Payer: BC Managed Care – PPO | Admitting: Physician Assistant

## 2017-07-19 ENCOUNTER — Other Ambulatory Visit: Payer: Self-pay

## 2017-07-19 ENCOUNTER — Ambulatory Visit (HOSPITAL_COMMUNITY)
Admission: EM | Admit: 2017-07-19 | Discharge: 2017-07-19 | Disposition: A | Discharge status: Home / Self Care | Payer: BC Managed Care – PPO | Attending: Family Medicine | Admitting: Family Medicine

## 2017-07-19 ENCOUNTER — Encounter (HOSPITAL_COMMUNITY): Payer: Self-pay | Admitting: Emergency Medicine

## 2017-07-19 DIAGNOSIS — J00 Acute nasopharyngitis [common cold]: Secondary | ICD-10-CM | POA: Diagnosis not present

## 2017-07-19 DIAGNOSIS — J029 Acute pharyngitis, unspecified: Secondary | ICD-10-CM

## 2017-07-19 LAB — POCT RAPID STREP A: STREPTOCOCCUS, GROUP A SCREEN (DIRECT): NEGATIVE

## 2017-07-19 MED ORDER — FLUTICASONE PROPIONATE 50 MCG/ACT NA SUSP: 2.0000 | Freq: Every day | NASAL | 0 refills | Status: DC

## 2017-07-19 MED ORDER — PHENOL 1.4 % MT LIQD: 1.0000 | OROMUCOSAL | 0 refills | Status: DC | PRN

## 2017-07-19 MED ORDER — CETIRIZINE-PSEUDOEPHEDRINE ER 5-120 MG PO TB12: 1.0000 | ORAL_TABLET | Freq: Every day | ORAL | 0 refills | Status: DC

## 2017-07-19 NOTE — ED Triage Notes (Signed)
Pt c/o sore throat x 1 week

## 2017-07-19 NOTE — Discharge Instructions (Signed)
Rapid strep negative. Symptoms are most likely due to viral illness. Start phenol for sore throat. Flonase and/or Zyrtec-D for nasal congestion. You can use over the counter nasal saline rinse such as neti pot for nasal congestion. Monitor for any worsening of symptoms, swelling of the throat, trouble breathing, trouble swallowing, follow up for reevaluation.   For sore throat try using a honey-based tea. Use 3 teaspoons of honey with juice squeezed from half lemon. Place shaved pieces of ginger into 1/2-1 cup of water and warm over stove top. Then mix the ingredients and repeat every 4 hours as needed.

## 2017-07-19 NOTE — ED Provider Notes (Signed)
[facility]    CSN: [id number] Arrival date & time: 07/19/17  1702     History   Chief Complaint Chief Complaint  Patient presents with  . Sore Throat    HPI Ashley Li is a 37 y.o. female.   37 year old female comes in for a week history of sore throat.  She has had mild cough, intermittent nasal congestion and rhinorrhea.  Denies fever, chills, night sweats.  States sore throat was worse whenever started, and has slowly decreased, but lingered.  OTC cold medications without relief.  No sick contact. Never smoker.       History reviewed. No pertinent past medical history.  There are no active problems to display for this patient.   History reviewed. No pertinent surgical history.  OB History    No data available       Home Medications    Prior to Admission medications   Medication Sig Start Date End Date Taking? Authorizing Provider  aspirin 325 MG tablet Take 325 mg by mouth once. Reported on 08/19/2015    [provider]  cetirizine-pseudoephedrine (ZYRTEC-D) 5-120 MG tablet Take 1 tablet by mouth daily. 07/19/17   Tasia Catchings, Amy V, PA-C  cyclobenzaprine (FLEXERIL) 10 MG tablet Take 1 tablet (10 mg total) by mouth at bedtime. 08/19/15   Leandrew Koyanagi, MD  fluticasone (FLONASE) 50 MCG/ACT nasal spray Place 2 sprays into both nostrils daily. 07/19/17   Tasia Catchings, Amy V, PA-C  medroxyPROGESTERone (DEPO-PROVERA) 150 MG/ML injection Inject 150 mg into the muscle every 3 (three) months.    [provider]  meloxicam (MOBIC) 15 MG tablet Take 1 tablet (15 mg total) by mouth daily. 08/19/15   Leandrew Koyanagi, MD  naproxen (NAPROSYN) 500 MG tablet Take 1 tablet (500 mg total) by mouth 2 (two) times daily. 5/00/93   Delora Fuel, MD  oxyCODONE-acetaminophen (PERCOCET) 5-325 MG tablet Take 1 tablet by mouth every 4 (four) hours as needed for moderate pain. 03/02/28   Delora Fuel, MD  phenol (CHLORASEPTIC) 1.4 % LIQD Use as directed 1 spray in the mouth or  throat as needed for throat irritation / pain. 07/19/17   Ok Edwards, PA-C  predniSONE (DELTASONE) 20 MG tablet 3/3/2/2/1/1 single daily dose for 6 days 08/19/15   Leandrew Koyanagi, MD    Family History No family history on file.  Social History Social History   Tobacco Use  . Smoking status: Never Smoker  . Smokeless tobacco: Never Used  Substance Use Topics  . Alcohol use: Yes    Comment: socially   . Drug use: No     Allergies   Patient has no known allergies.   Review of Systems Review of Systems  Reason unable to perform ROS: See HPI as above.     Physical Exam Triage Vital Signs ED Triage Vitals  Enc Vitals Group     BP 07/19/17 1804 118/72     Pulse Rate 07/19/17 1804 91     Resp 07/19/17 1804 14     Temp 07/19/17 1804 98.5 F (36.9 C)     Temp src --      SpO2 07/19/17 1804 100 %     Weight --      Height --      Head Circumference --      Peak Flow --      Pain Score 07/19/17 1805 7     Pain Loc --      Pain Edu? --  Excl. in GC? --    No data found.  Updated Vital Signs BP 118/72   Pulse 91   Temp 98.5 F (36.9 C)   Resp 14   SpO2 100%   Physical Exam  Constitutional: She is oriented to person, place, and time. She appears well-developed and well-nourished. No distress.  HENT:  Head: Normocephalic and atraumatic.  Right Ear: External ear and ear canal normal. Tympanic membrane is erythematous. Tympanic membrane is not bulging.  Left Ear: Tympanic membrane, external ear and ear canal normal. Tympanic membrane is not erythematous and not bulging.  Nose: Nose normal. Right sinus exhibits no maxillary sinus tenderness and no frontal sinus tenderness. Left sinus exhibits no maxillary sinus tenderness and no frontal sinus tenderness.  Mouth/Throat: Uvula is midline, oropharynx is clear and moist and mucous membranes are normal.  Eyes: Conjunctivae are normal. Pupils are equal, round, and reactive to light.  Neck: Normal range of motion. Neck  supple.  Cardiovascular: Normal rate, regular rhythm and normal heart sounds. Exam reveals no gallop and no friction rub.  No murmur heard. Pulmonary/Chest: Effort normal and breath sounds normal. She has no decreased breath sounds. She has no wheezes. She has no rhonchi. She has no rales.  Lymphadenopathy:    She has no cervical adenopathy.  Neurological: She is alert and oriented to person, place, and time.  Skin: Skin is warm and dry.  Psychiatric: She has a normal mood and affect. Her behavior is normal. Judgment normal.     UC Treatments / Results  Labs (all labs ordered are listed, but only abnormal results are displayed) Labs Reviewed  CULTURE, GROUP A STREP [facility])  POCT RAPID STREP A    EKG  EKG Interpretation None       Radiology No results found.  Procedures Procedures (including critical care time)  Medications Ordered in UC Medications - No data to display   Initial Impression / Assessment and Plan / UC Course  I have reviewed the triage vital signs and the nursing notes.  Pertinent labs & imaging results that were available during my care of the patient were reviewed by me and considered in my medical decision making (see chart for details).    Rapid strep negative. Symptomatic treatment as needed. Return precautions given.   Final Clinical Impressions(s) / UC Diagnoses   Final diagnoses:  Acute nasopharyngitis    ED Discharge Orders        Ordered    fluticasone (FLONASE) 50 MCG/ACT nasal spray  Daily     07/19/17 1951    cetirizine-pseudoephedrine (ZYRTEC-D) 5-120 MG tablet  Daily     07/19/17 1951    phenol (CHLORASEPTIC) 1.4 % LIQD  As needed     07/19/17 [place], Amy V, Hershal Coria 07/19/17 1957

## 2017-07-21 LAB — CULTURE, GROUP A STREP (THRC)

## 2017-08-20 ENCOUNTER — Encounter: Payer: Self-pay | Admitting: Physician Assistant

## 2017-08-20 ENCOUNTER — Ambulatory Visit: Payer: BC Managed Care – PPO | Admitting: Physician Assistant

## 2017-08-20 VITALS — BP 130/80 | HR 85 | Temp 98.7°F | Resp 17 | Ht 64.0 in | Wt 175.0 lb

## 2017-08-20 DIAGNOSIS — Z113 Encounter for screening for infections with a predominantly sexual mode of transmission: Secondary | ICD-10-CM

## 2017-08-20 DIAGNOSIS — Z124 Encounter for screening for malignant neoplasm of cervix: Secondary | ICD-10-CM | POA: Diagnosis not present

## 2017-08-20 DIAGNOSIS — Z01419 Encounter for gynecological examination (general) (routine) without abnormal findings: Secondary | ICD-10-CM

## 2017-08-20 LAB — POCT WET + KOH PREP
Trich by wet prep: ABSENT
Yeast by KOH: ABSENT
Yeast by wet prep: ABSENT

## 2017-08-20 NOTE — Progress Notes (Signed)
Ashley Li  MRN: [id number] DOB: 05-21-1981  PCP: Wardell Honour, MD  Subjective:  Pt is a pleasant 37 year old female who presents to clinic for screening for STDs. She is sexually active with one female partner. No condoms. She is asymptomatic today "I'm just trying to be safe about it" "I think he is monogamous, but you never know."  She endorses some thicker and "darker" vaginal discharge She denies urinary symptoms, pain with sex, abdominal pain, flank pain, back pain, fever, chills, vaginal itching. Implant birth control. She does not have periods.  Last PAP - unknown.   Review of Systems  Constitutional: Negative for chills, fatigue and fever.  Respiratory: Negative for cough, shortness of breath and wheezing.   Cardiovascular: Negative for chest pain and palpitations.  Gastrointestinal: Negative for abdominal pain, diarrhea, nausea and vomiting.  Genitourinary: Positive for vaginal discharge. Negative for decreased urine volume, difficulty urinating, dyspareunia, dysuria, enuresis, flank pain, frequency, hematuria, menstrual problem, pelvic pain, urgency and vaginal pain.  Musculoskeletal: Negative for back pain.  Neurological: Negative for dizziness, weakness, light-headedness and headaches.    There are no active problems to display for this patient.   Current Outpatient Medications on File Prior to Visit  Medication Sig Dispense Refill  . aspirin 325 MG tablet Take 325 mg by mouth once. Reported on 08/19/2015    . cetirizine-pseudoephedrine (ZYRTEC-D) 5-120 MG tablet Take 1 tablet by mouth daily. 15 tablet 0  . cyclobenzaprine (FLEXERIL) 10 MG tablet Take 1 tablet (10 mg total) by mouth at bedtime. 30 tablet 0  . fluticasone (FLONASE) 50 MCG/ACT nasal spray Place 2 sprays into both nostrils daily. 1 g 0  . medroxyPROGESTERone (DEPO-PROVERA) 150 MG/ML injection Inject 150 mg into the muscle every 3 (three) months.    . meloxicam (MOBIC) 15 MG tablet Take 1 tablet (15  mg total) by mouth daily. 30 tablet 0  . naproxen (NAPROSYN) 500 MG tablet Take 1 tablet (500 mg total) by mouth 2 (two) times daily. 30 tablet 0  . oxyCODONE-acetaminophen (PERCOCET) 5-325 MG tablet Take 1 tablet by mouth every 4 (four) hours as needed for moderate pain. 15 tablet 0  . phenol (CHLORASEPTIC) 1.4 % LIQD Use as directed 1 spray in the mouth or throat as needed for throat irritation / pain. 1 Bottle 0  . predniSONE (DELTASONE) 20 MG tablet 3/3/2/2/1/1 single daily dose for 6 days (Patient not taking: Reported on 08/20/2017) 12 tablet 0   No current facility-administered medications on file prior to visit.     No Known Allergies   Objective:  BP 130/80   Pulse 85   Temp 98.7 F (37.1 C) (Oral)   Resp 17   Ht 5\' 4"  (1.626 m)   Wt 175 lb (79.4 kg)   SpO2 98%   BMI 30.04 kg/m   Physical Exam  Constitutional: She is oriented to person, place, and time and well-developed, well-nourished, and in no distress. No distress.  Cardiovascular: Normal rate, regular rhythm and normal heart sounds.  Abdominal: Soft. Normal appearance. There is no tenderness.  Genitourinary: Uterus normal, cervix normal, right adnexa normal, left adnexa normal and vulva normal. Thin  white and vaginal discharge found.  Neurological: She is alert and oriented to person, place, and time. GCS score is 15.  Skin: Skin is warm and dry.  Psychiatric: Mood, memory, affect and judgment normal.  Vitals reviewed.   Assessment and Plan :  1. Screen for STD (sexually transmitted disease) - RPR -  Hepatitis C antibody - POCT Wet + KOH Prep - HIV antibody (with reflex) 2. Screening for cervical cancer 3. Encounter for gynecological examination without abnormal finding - Pap IG, CT/NG NAA, and HPV (high risk) - Pt presents for routine screening for STDs. She is asymptomatic today. She has intercourse with one female partner. Implant birth control. Unknown last PAP - PAP done today. Labs are pending. Will contact  with results.     Mercer Pod, PA-C  [facility] 08/20/2017 5:23 PM

## 2017-08-20 NOTE — Patient Instructions (Addendum)
We will contact you with your lab results when they come back. The recommendation is for ladies to get a PAP to screen for cervical cancer every three years (unless you have an abnormal results warranting earlier testing).    Safe Sex Practicing safe sex means taking steps before and during sex to reduce your risk of:  Getting an STD (sexually transmitted disease).  Giving your partner an STD.  Unwanted pregnancy.  How can I practice safe sex?  To practice safe sex:  Limit your sexual partners to only one partner who is having sex with only you.  Avoid using alcohol and recreational drugs before having sex. These substances can affect your judgment.  Before having sex with a new partner: ? Talk to your partner about past partners, past STDs, and drug use. ? You and your partner should be screened for STDs and discuss the results with each other.  Check your body regularly for sores, blisters, rashes, or unusual discharge. If you notice any of these problems, visit your health care provider.  If you have symptoms of an infection or you are being treated for an STD, avoid sexual contact.  While having sex, use a condom. Make sure to: ? Use a condom every time you have vaginal, oral, or anal sex. Both females and males should wear condoms during oral sex. ? Keep condoms in place from the beginning to the end of sexual activity. ? Use a latex condom, if possible. Latex condoms offer the best protection. ? Use only water-based lubricants or oils to lubricate a condom. Using petroleum-based lubricants or oils will weaken the condom and increase the chance that it will break.  See your health care provider for regular screenings, exams, and tests for STDs.  Talk with your health care provider about the form of birth control (contraception) that is best for you.  Get vaccinated against hepatitis B and human papillomavirus (HPV).  If you are at risk of being infected with HIV (human  immunodeficiency virus), talk with your health care provider about taking a prescription medicine to prevent HIV infection. You are considered at risk for HIV if: ? You are a man who has sex with other men. ? You are a heterosexual man or woman who is sexually active with more than one partner. ? You take drugs by injection. ? You are sexually active with a partner who has HIV.  This information is not intended to replace advice given to you by your health care provider. Make sure you discuss any questions you have with your health care provider. Document Released: 08/09/2004 Document Revised: 11/16/2015 Document Reviewed: 05/22/2015 Elsevier Interactive Patient Education  [employer].   Thank you for coming in today. I hope you feel we met your needs.  Feel free to call PCP if you have any questions or further requests.  Please consider signing up for MyChart if you do not already have it, as this is a great way to communicate with me.  Best,  Whitney McVey, PA-C   IF you received an x-ray today, you will receive an invoice from [facility]. Please contact [facility] at [phone] with questions or concerns regarding your invoice.   IF you received labwork today, you will receive an invoice from [place]. Please contact [facility] at [phone] with questions or concerns regarding your invoice.   Our billing staff will not be able to assist you with questions regarding bills from these companies.  You will be contacted with  the lab results as soon as they are available. The fastest way to get your results is to activate your My Chart account. Instructions are located on the last page of this paperwork. If you have not heard from [place] regarding the results in 2 weeks, please contact this office.

## 2017-08-21 LAB — RPR: RPR Ser Ql: NONREACTIVE

## 2017-08-21 LAB — HEPATITIS C ANTIBODY: Hep C Virus Ab: <0.1 {s_co_ratio} (ref 0.0–0.9)

## 2017-08-21 LAB — HIV ANTIBODY (ROUTINE TESTING W REFLEX): HIV Screen 4th Generation wRfx: NONREACTIVE

## 2017-08-22 LAB — PAP IG, CT-NG NAA, HPV HIGH-RISK
Chlamydia, Nuc. Acid Amp: NEGATIVE
Gonococcus by Nucleic Acid Amp: NEGATIVE
HPV, high-risk: NEGATIVE
PAP Smear Comment: 0

## 2017-08-23 ENCOUNTER — Encounter: Payer: Self-pay | Admitting: Physician Assistant

## 2017-08-23 ENCOUNTER — Telehealth: Payer: Self-pay

## 2017-08-23 ENCOUNTER — Other Ambulatory Visit: Payer: Self-pay | Admitting: Physician Assistant

## 2017-08-23 DIAGNOSIS — B9689 Other specified bacterial agents as the cause of diseases classified elsewhere: Secondary | ICD-10-CM

## 2017-08-23 DIAGNOSIS — N760 Acute vaginitis: Principal | ICD-10-CM

## 2017-08-23 MED ORDER — METRONIDAZOLE 500 MG PO TABS: 500.0000 mg | ORAL_TABLET | Freq: Two times a day (BID) | ORAL | 0 refills | Status: DC

## 2017-08-23 NOTE — Telephone Encounter (Signed)
Copied from [place]. Topic: General - Call Back - No Documentation >> Aug 23, 2017  9:04 AM Aurelio Brash B wrote: Reason for CRM: pt called back - no crm

## 2017-08-23 NOTE — Telephone Encounter (Signed)
Phone call to patient. She verbalizes understanding of message and results. She wanted to call back to request a medication from provider for yeast infections, states she gets one when she has taken metronidazole in the past.   Also, patient requests her metronidazole and any other medications be [facility] to [facility] on [address].   Phone call to [facility], spoke to [employer], Metronidazole 500mg  tablet cancelled.  Preferred pharmacy updated, metronidazole 500mg  [facility] [address], spoke with [place].  Provider, please send something in for yeast infection for patient.

## 2017-08-23 NOTE — Progress Notes (Signed)
Please call pt and let her know she is negative for gonorrhea and chlamydia. PAP is also negative -next PAP in 3 years. She should pick up antibiotic at pharmacy for BV. Results have been released to [place].  Thank you!!!

## 2017-08-23 NOTE — Telephone Encounter (Signed)
Pt called back about her lab work, and stated she would medication for a yeast infection b/c of previous medication.

## 2017-08-26 ENCOUNTER — Other Ambulatory Visit: Payer: Self-pay | Admitting: Physician Assistant

## 2017-08-26 DIAGNOSIS — B3731 Acute candidiasis of vulva and vagina: Secondary | ICD-10-CM

## 2017-08-26 DIAGNOSIS — B373 Candidiasis of vulva and vagina: Secondary | ICD-10-CM

## 2017-08-26 MED ORDER — FLUCONAZOLE 150 MG PO TABS: 150.0000 mg | ORAL_TABLET | Freq: Once | ORAL | 0 refills | Status: AC

## 2017-08-26 NOTE — Telephone Encounter (Signed)
Done. Thank you.

## 2018-06-17 ENCOUNTER — Encounter: Payer: Self-pay | Admitting: Family Medicine

## 2018-06-17 ENCOUNTER — Ambulatory Visit (INDEPENDENT_AMBULATORY_CARE_PROVIDER_SITE_OTHER): Payer: BC Managed Care – PPO | Admitting: Family Medicine

## 2018-06-17 VITALS — BP 126/75 | HR 73 | Temp 99.6°F | Ht 62.5 in | Wt 168.8 lb

## 2018-06-17 DIAGNOSIS — Z7251 High risk heterosexual behavior: Secondary | ICD-10-CM

## 2018-06-17 DIAGNOSIS — Z113 Encounter for screening for infections with a predominantly sexual mode of transmission: Secondary | ICD-10-CM | POA: Diagnosis not present

## 2018-06-17 LAB — POCT WET + KOH PREP
Trich by wet prep: ABSENT
YEAST BY KOH: ABSENT
YEAST BY WET PREP: ABSENT

## 2018-06-17 NOTE — Patient Instructions (Addendum)
  I recommend condoms every time. We will let you know results of STI testing.  Thanks for coming in today.   If you have lab work done today you will be contacted with your lab results within the next 2 weeks.  If you have not heard from [place] then please contact us. The fastest way to get your results is to register for My Chart.   IF you received an x-ray today, you will receive an invoice from [facility]. Please contact [facility] at [phone] with questions or concerns regarding your invoice.   IF you received labwork today, you will receive an invoice from [place]. Please contact [facility] at [phone] with questions or concerns regarding your invoice.   Our billing staff will not be able to assist you with questions regarding bills from these companies.  You will be contacted with the lab results as soon as they are available. The fastest way to get your results is to activate your My Chart account. Instructions are located on the last page of this paperwork. If you have not heard from [place] regarding the results in 2 weeks, please contact this office.

## 2018-06-17 NOTE — Progress Notes (Signed)
Subjective:    Patient ID: Ashley Li, female    DOB: [birth year], 37 y.o.   MRN: [id number]  HPI Ashley Li is a 37 y.o. female Presents today for: Chief Complaint  Patient presents with  . STD screening    new partner    New sexual partner 2 weeks ago including unprotected intercourse. No new vaginal rash, or discharge. No recent illness or flu like symptoms.  History of trichomonas and chlamydia last year, both treated. Negative STI testing in February.   Patient Active Problem List   Diagnosis Date Noted  . Screening for cervical cancer 08/20/2017   History reviewed. No pertinent past medical history. History reviewed. No pertinent surgical history. No Known Allergies Prior to Admission medications   Medication Sig Start Date End Date Taking? Authorizing Provider  etonogestrel (NEXPLANON) 68 MG IMPL implant 1 each by Subdermal route once.   Yes [provider]  Multiple Vitamin (MULTIVITAMIN) capsule Take 1 capsule by mouth daily.   Yes [provider]   Social History   Socioeconomic History  . Marital status: Single    Spouse name: Not on file  . Number of children: Not on file  . Years of education: Not on file  . Highest education level: Not on file  Occupational History  . Not on file  Social Needs  . Financial resource strain: Not on file  . Food insecurity:    Worry: Not on file    Inability: Not on file  . Transportation needs:    Medical: Not on file    Non-medical: Not on file  Tobacco Use  . Smoking status: Never Smoker  . Smokeless tobacco: Never Used  Substance and Sexual Activity  . Alcohol use: Yes    Comment: socially   . Drug use: No  . Sexual activity: Yes    Birth control/protection: Inserts  Lifestyle  . Physical activity:    Days per week: Not on file    Minutes per session: Not on file  . Stress: Not on file  Relationships  . Social connections:    Talks on phone: Not on file    Gets together: Not  on file    Attends religious service: Not on file    Active member of club or organization: Not on file    Attends meetings of clubs or organizations: Not on file    Relationship status: Not on file  . Intimate partner violence:    Fear of current or ex partner: Not on file    Emotionally abused: Not on file    Physically abused: Not on file    Forced sexual activity: Not on file  Other Topics Concern  . Not on file  Social History Narrative  . Not on file    Review of Systems  Constitutional: Negative for fever.  Genitourinary: Negative for difficulty urinating, genital sores, vaginal discharge and vaginal pain.       Objective:   Physical Exam  Constitutional: She is oriented to person, place, and time. She appears well-developed and well-nourished. No distress.  HENT:  Head: Normocephalic and atraumatic.  Cardiovascular: Normal rate.  Pulmonary/Chest: Effort normal.  Abdominal: There is no tenderness.  Neurological: She is alert and oriented to person, place, and time.  Psychiatric: She has a normal mood and affect.   Vitals:   06/17/18 0948  BP: 126/75  Pulse: 73  Temp: 99.6 F (37.6 C)  TempSrc: Oral  SpO2: 96%  Weight:  168 lb 12.8 oz (76.6 kg)  Height: 5' 2.5" (1.588 m)   Negative wet prep for trich.    Assessment & Plan:  Ashley Li is a 37 y.o. female Screen for STD (sexually transmitted disease) - Plan: HIV Antibody (routine testing w rflx), GC/Chlamydia Probe Amp, RPR, POCT Wet + KOH Prep  History of unprotected sex - Plan: POCT Wet + KOH Prep  Asymptomatic at this time.  Safer sex practices discussed, STI testing as above.  Option of repeat testing in 6 weeks and 6 months discussed.  No orders of the defined types were placed in this encounter.  Patient Instructions    I recommend condoms every time. We will let you know results of STI testing.  Thanks for coming in today.   If you have lab work done today you will be contacted with your  lab results within the next 2 weeks.  If you have not heard from [place] then please contact us. The fastest way to get your results is to register for My Chart.   IF you received an x-ray today, you will receive an invoice from [facility]. Please contact [facility] at [phone] with questions or concerns regarding your invoice.   IF you received labwork today, you will receive an invoice from [facility]. Please contact [facility] at [phone] with questions or concerns regarding your invoice.   Our billing staff will not be able to assist you with questions regarding bills from these companies.  You will be contacted with the lab results as soon as they are available. The fastest way to get your results is to activate your My Chart account. Instructions are located on the last page of this paperwork. If you have not heard from [place] regarding the results in 2 weeks, please contact this office.       Signed,   Merri Ray, MD [facility].  06/17/18 10:27 AM

## 2018-06-18 LAB — GC/CHLAMYDIA PROBE AMP
Chlamydia trachomatis, NAA: NEGATIVE
NEISSERIA GONORRHOEAE BY PCR: NEGATIVE

## 2018-06-18 LAB — RPR: RPR: NONREACTIVE

## 2018-06-18 LAB — HIV ANTIBODY (ROUTINE TESTING W REFLEX): HIV Screen 4th Generation wRfx: NONREACTIVE

## 2018-06-23 ENCOUNTER — Telehealth: Payer: Self-pay | Admitting: Family Medicine

## 2018-06-23 NOTE — Telephone Encounter (Signed)
Copied from [place] [id number]. Topic: General - Other >> Jun 23, 2018  2:00 PM Cecelia Byars, NT wrote: Reason for CRM: Patient called and would like a return call with her lab results , please call her  at  [phone]

## 2018-06-24 NOTE — Telephone Encounter (Signed)
Pt called noting abnormal lab result was released in mychart without a msg. She is asking if medications are being sent in for her. Please advise.

## 2018-06-26 ENCOUNTER — Telehealth: Payer: Self-pay

## 2018-06-26 ENCOUNTER — Encounter: Payer: Self-pay | Admitting: Family Medicine

## 2018-06-26 NOTE — Telephone Encounter (Signed)
Pt calling to get lab results, pt is able to view on mychart but wants clarification on results.  Advised pt per [facility] std screenings are negative and wet prep results showed bacteria but normal bacteria, advised to take probiotic daily to help balance bacteria but no rx needed.  Pt verbalized understanding. Dgaddy, CMA

## 2018-06-26 NOTE — Telephone Encounter (Signed)
Pt would like to speak with a nurse again regarding some questions she has about her results.

## 2018-06-26 NOTE — Telephone Encounter (Signed)
Ashley Li spoke with patient about results.

## 2018-06-26 NOTE — Telephone Encounter (Signed)
Pt called because she still hasn't heard about this.  Contacted FC at office, but pt hung up while waiting for clinical staff.

## 2018-07-02 ENCOUNTER — Ambulatory Visit: Payer: BC Managed Care – PPO | Admitting: Family Medicine

## 2019-09-22 ENCOUNTER — Other Ambulatory Visit: Payer: Self-pay

## 2019-09-22 ENCOUNTER — Ambulatory Visit: Payer: BC Managed Care – PPO | Admitting: Family Medicine

## 2019-09-22 ENCOUNTER — Telehealth: Payer: Self-pay

## 2019-09-22 VITALS — BP 147/83 | HR 80 | Temp 98.2°F | Resp 14 | Ht 62.5 in | Wt 180.0 lb

## 2019-09-22 DIAGNOSIS — G479 Sleep disorder, unspecified: Secondary | ICD-10-CM

## 2019-09-22 DIAGNOSIS — N898 Other specified noninflammatory disorders of vagina: Secondary | ICD-10-CM | POA: Diagnosis not present

## 2019-09-22 DIAGNOSIS — Z23 Encounter for immunization: Secondary | ICD-10-CM

## 2019-09-22 DIAGNOSIS — Z113 Encounter for screening for infections with a predominantly sexual mode of transmission: Secondary | ICD-10-CM | POA: Diagnosis not present

## 2019-09-22 DIAGNOSIS — Z13228 Encounter for screening for other metabolic disorders: Secondary | ICD-10-CM | POA: Diagnosis not present

## 2019-09-22 DIAGNOSIS — Z0001 Encounter for general adult medical examination with abnormal findings: Secondary | ICD-10-CM | POA: Diagnosis not present

## 2019-09-22 DIAGNOSIS — Z0000 Encounter for general adult medical examination without abnormal findings: Secondary | ICD-10-CM

## 2019-09-22 DIAGNOSIS — Z124 Encounter for screening for malignant neoplasm of cervix: Secondary | ICD-10-CM

## 2019-09-22 LAB — POCT WET + KOH PREP
Trich by wet prep: ABSENT
Yeast by KOH: ABSENT
Yeast by wet prep: ABSENT

## 2019-09-22 MED ORDER — METRONIDAZOLE 500 MG PO TABS: 500.0000 mg | ORAL_TABLET | Freq: Two times a day (BID) | ORAL | 0 refills | Status: DC

## 2019-09-22 NOTE — Progress Notes (Signed)
Contacted pt via phone w/ results.  See 09/22/2019 phone note.

## 2019-09-22 NOTE — Telephone Encounter (Signed)
Notified pt via phone wet prep positive for bacterial vaginosis and flagyl has been sent over to her pharmacy on file.  Pt appreciative and agreeable with course of action.

## 2019-09-22 NOTE — Patient Instructions (Addendum)
   If you have lab work done today you will be contacted with your lab results within the next 2 weeks.  If you have not heard from us then please contact us. The fastest way to get your results is to register for My Chart.   IF you received an x-ray today, you will receive an invoice from [facility]. Please contact [facility] at [phone] with questions or concerns regarding your invoice.   IF you received labwork today, you will receive an invoice from [facility]. Please contact [facility] at [phone] with questions or concerns regarding your invoice.   Our billing staff will not be able to assist you with questions regarding bills from these companies.  You will be contacted with the lab results as soon as they are available. The fastest way to get your results is to activate your My Chart account. Instructions are located on the last page of this paperwork. If you have not heard from us regarding the results in 2 weeks, please contact this office.     Insomnia Insomnia is a sleep disorder that makes it difficult to fall asleep or stay asleep. Insomnia can cause fatigue, low energy, difficulty concentrating, mood swings, and poor performance at work or school. There are three different ways to classify insomnia:  Difficulty falling asleep.  Difficulty staying asleep.  Waking up too early in the morning. Any type of insomnia can be long-term (chronic) or short-term (acute). Both are common. Short-term insomnia usually lasts for three months or less. Chronic insomnia occurs at least three times a week for longer than three months. What are the causes? Insomnia may be caused by another condition, situation, or substance, such as:  Anxiety.  Certain medicines.  Gastroesophageal reflux disease (GERD) or other gastrointestinal conditions.  Asthma or other breathing conditions.  Restless legs syndrome, sleep apnea, or other sleep disorders.  Chronic  pain.  Menopause.  Stroke.  Abuse of alcohol, tobacco, or illegal drugs.  Mental health conditions, such as depression.  Caffeine.  Neurological disorders, such as Alzheimer's disease.  An overactive thyroid (hyperthyroidism). Sometimes, the cause of insomnia may not be known. What increases the risk? Risk factors for insomnia include:  Gender. Women are affected more often than men.  Age. Insomnia is more common as you get older.  Stress.  Lack of exercise.  Irregular work schedule or working night shifts.  Traveling between different time zones.  Certain medical and mental health conditions. What are the signs or symptoms? If you have insomnia, the main symptom is having trouble falling asleep or having trouble staying asleep. This may lead to other symptoms, such as:  Feeling fatigued or having low energy.  Feeling nervous about going to sleep.  Not feeling rested in the morning.  Having trouble concentrating.  Feeling irritable, anxious, or depressed. How is this diagnosed? This condition may be diagnosed based on:  Your symptoms and medical history. Your health care provider may ask about: ? Your sleep habits. ? Any medical conditions you have. ? Your mental health.  A physical exam. How is this treated? Treatment for insomnia depends on the cause. Treatment may focus on treating an underlying condition that is causing insomnia. Treatment may also include:  Medicines to help you sleep.  Counseling or therapy.  Lifestyle adjustments to help you sleep better. Follow these instructions at home: Eating and drinking   Limit or avoid alcohol, caffeinated beverages, and cigarettes, especially close to bedtime. These can disrupt your sleep.    Do not eat a large meal or eat spicy foods right before bedtime. This can lead to digestive discomfort that can make it hard for you to sleep. Sleep habits   Keep a sleep diary to help you and your health care  provider figure out what could be causing your insomnia. Write down: ? When you sleep. ? When you wake up during the night. ? How well you sleep. ? How rested you feel the next day. ? Any side effects of medicines you are taking. ? What you eat and drink.  Make your bedroom a dark, comfortable place where it is easy to fall asleep. ? Put up shades or blackout curtains to block light from outside. ? Use a white noise machine to block noise. ? Keep the temperature cool.  Limit screen use before bedtime. This includes: ? Watching TV. ? Using your smartphone, tablet, or computer.  Stick to a routine that includes going to bed and waking up at the same times every day and night. This can help you fall asleep faster. Consider making a quiet activity, such as reading, part of your nighttime routine.  Try to avoid taking naps during the day so that you sleep better at night.  Get out of bed if you are still awake after 15 minutes of trying to sleep. Keep the lights down, but try reading or doing a quiet activity. When you feel sleepy, go back to bed. General instructions  Take over-the-counter and prescription medicines only as told by your health care provider.  Exercise regularly, as told by your health care provider. Avoid exercise starting several hours before bedtime.  Use relaxation techniques to manage stress. Ask your health care provider to suggest some techniques that may work well for you. These may include: ? Breathing exercises. ? Routines to release muscle tension. ? Visualizing peaceful scenes.  Make sure that you drive carefully. Avoid driving if you feel very sleepy.  Keep all follow-up visits as told by your health care provider. This is important. Contact a health care provider if:  You are tired throughout the day.  You have trouble in your daily routine due to sleepiness.  You continue to have sleep problems, or your sleep problems get worse. Get help right  away if:  You have serious thoughts about hurting yourself or someone else. If you ever feel like you may hurt yourself or others, or have thoughts about taking your own life, get help right away. You can go to your nearest emergency department or call:  Your local emergency services (911 in the [place]  A suicide crisis helpline, such as the National Suicide Prevention Lifeline at 1-800-273-8255. This is open 24 hours a day. Summary  Insomnia is a sleep disorder that makes it difficult to fall asleep or stay asleep.  Insomnia can be long-term (chronic) or short-term (acute).  Treatment for insomnia depends on the cause. Treatment may focus on treating an underlying condition that is causing insomnia.  Keep a sleep diary to help you and your health care provider figure out what could be causing your insomnia. This information is not intended to replace advice given to you by your health care provider. Make sure you discuss any questions you have with your health care provider. Document Revised: 06/14/2017 Document Reviewed: 04/11/2017 [employer] Patient Education  2020 [employer]  

## 2019-09-22 NOTE — Progress Notes (Signed)
Chief Complaint  Patient presents with  . Gynecologic Exam    pap no concerns    Subjective:  Ashley Li is a 39 y.o. female here for a health maintenance visit.  Patient is established pt  Patient Active Problem List   Diagnosis Date Noted  . Screening for cervical cancer 08/20/2017    No past medical history on file.  No past surgical history on file.   Outpatient Medications Prior to Visit  Medication Sig Dispense Refill  . etonogestrel (NEXPLANON) 68 MG IMPL implant 1 each by Subdermal route once.    . Multiple Vitamin (MULTIVITAMIN) capsule Take 1 capsule by mouth daily.     No facility-administered medications prior to visit.    No Known Allergies   No family history on file.   Health Habits: Dental Exam: up to date Eye Exam: up to date Exercise: 0 times/week on average Current exercise activities: walking/running Diet: balanced  Social History   Socioeconomic History  . Marital status: Single    Spouse name: Not on file  . Number of children: Not on file  . Years of education: Not on file  . Highest education level: Not on file  Occupational History  . Not on file  Tobacco Use  . Smoking status: Never Smoker  . Smokeless tobacco: Never Used  Substance and Sexual Activity  . Alcohol use: Yes    Comment: socially   . Drug use: No  . Sexual activity: Yes    Birth control/protection: Inserts  Other Topics Concern  . Not on file  Social History Narrative  . Not on file   Social Determinants of Health   Financial Resource Strain:   . Difficulty of Paying Living Expenses: Not on file  Food Insecurity:   . Worried About Charity fundraiser in the Last Year: Not on file  . Ran Out of Food in the Last Year: Not on file  Transportation Needs:   . Lack of Transportation (Medical): Not on file  . Lack of Transportation (Non-Medical): Not on file  Physical Activity:   . Days of Exercise per Week: Not on file  . Minutes of Exercise per  Session: Not on file  Stress:   . Feeling of Stress : Not on file  Social Connections:   . Frequency of Communication with Friends and Family: Not on file  . Frequency of Social Gatherings with Friends and Family: Not on file  . Attends Religious Services: Not on file  . Active Member of Clubs or Organizations: Not on file  . Attends Archivist Meetings: Not on file  . Marital Status: Not on file  Intimate Partner Violence:   . Fear of Current or Ex-Partner: Not on file  . Emotionally Abused: Not on file  . Physically Abused: Not on file  . Sexually Abused: Not on file   Social History   Substance and Sexual Activity  Alcohol Use Yes   Comment: socially    Social History   Tobacco Use  Smoking Status Never Smoker  Smokeless Tobacco Never Used   Social History   Substance and Sexual Activity  Drug Use No    GYN: Sexual Health Menstrual status: regular menses LMP: No LMP recorded. Patient has had an implant. Last pap smear: see HM section History of abnormal pap smears:  Sexually active: with female partner Current contraception: implant  Health Maintenance: See under health Maintenance activity for review of completion dates as well. Immunization History  Administered Date(s) Administered  . Influenza,inj,Quad PF,6+ Mos 04/22/2018  . Influenza-Unspecified 05/06/2019  . Tdap 09/22/2019      Depression Screen-PHQ2/9 Depression screen [facility] 2/9 09/22/2019 06/17/2018 08/20/2017 08/19/2015  Decreased Interest 0 0 0 0  Down, Depressed, Hopeless 0 0 0 0  PHQ - 2 Score 0 0 0 0       Depression Severity and Treatment Recommendations:  0-4= None  5-9= Mild / Treatment: Support, educate to call if worse; return in one month  10-14= Moderate / Treatment: Support, watchful waiting; Antidepressant or Psycotherapy  15-19= Moderately severe / Treatment: Antidepressant OR Psychotherapy  >= 20 = Major depression, severe / Antidepressant AND Psychotherapy    Review  of Systems   ROS  See HPI for ROS as well.   Review of Systems  Constitutional: Negative for activity change, appetite change, chills and fever.  HENT: Negative for congestion, nosebleeds, trouble swallowing and voice change.   Respiratory: Negative for cough, shortness of breath and wheezing.   Gastrointestinal: Negative for diarrhea, nausea and vomiting.  Genitourinary: Negative for difficulty urinating, dysuria, flank pain and hematuria.  Musculoskeletal: Negative for back pain, joint swelling and neck pain.  Neurological: Negative for dizziness, speech difficulty, light-headedness and numbness.  See HPI. All other review of systems negative.    Objective:   Vitals:   09/22/19 1106  BP: (!) 147/83  Pulse: 80  Resp: 14  Temp: 98.2 F (36.8 C)  TempSrc: Temporal  SpO2: 98%  Weight: 180 lb (81.6 kg)  Height: 5' 2.5" (1.588 m)    Body mass index is 32.4 kg/m.  Physical Exam  BP (!) 147/83   Pulse 80   Temp 98.2 F (36.8 C) (Temporal)   Resp 14   Ht 5' 2.5" (1.588 m)   Wt 180 lb (81.6 kg)   SpO2 98%   BMI 32.40 kg/m   General Appearance:    Alert, cooperative, no distress, appears stated age  Head:    Normocephalic, without obvious abnormality, atraumatic  Eyes:    PERRL, conjunctiva/corneas clear, EOM's intact, fundi    benign, both eyes  Ears:    Normal TM's and external ear canals, both ears  Nose:   Nares normal, septum midline, mucosa normal, no drainage    or sinus tenderness  Throat:   Lips, mucosa, and tongue normal; teeth and gums normal  Neck:   Supple, symmetrical, trachea midline, no adenopathy;    thyroid:  no enlargement/tenderness/nodules; no carotid   bruit or JVD  Back:     Symmetric, no curvature, ROM normal, no CVA tenderness  Lungs:     Clear to auscultation bilaterally, respirations unlabored  Chest Wall:    No tenderness or deformity   Heart:    Regular rate and rhythm, S1 and S2 normal, no murmur, rub   or gallop  Abdomen:     Soft,  non-tender, bowel sounds active all four quadrants,    no masses, no organomegaly  Genitalia:    Chaperone present, Normal external genitalia, without lesion, white discharge, no tenderness  Extremities:   Extremities normal, atraumatic, no cyanosis or edema  Pulses:   2+ and symmetric all extremities  Skin:   Skin color, texture, turgor normal, no rashes or lesions  Lymph nodes:   Cervical, supraclavicular, and axillary nodes normal  Neurologic:   CNII-XII intact, normal strength, sensation and reflexes    throughout      Assessment/Plan:   Patient was seen for a health maintenance exam.  Counseled the patient on health maintenance issues. Reviewed her health mainteance schedule and ordered appropriate tests (see orders.) Counseled on regular exercise and weight management. Recommend regular eye exams and dental cleaning.   The following issues were addressed today for health maintenance:   Ashley Li was seen today for gynecologic exam.  Diagnoses and all orders for this visit:  Encounter for health maintenance examination in adult -  Women's Health Maintenance Plan Advised monthly breast exam and annual mammogram Advised dental exam every six months Discussed stress management Discussed pap smear screening guidelines  Screening for STDs (sexually transmitted diseases) -     STD Panel  Screening for cervical cancer- pap smear done 08/20/2017 up to date, HPV neg, pap due in 2022  Need for Tdap vaccination -     Tdap vaccine greater than or equal to 7yo IM  Encounter for screening for other metabolic disorders -     Lipid panel  Vaginal discharge- BV detected on wet prep, flagyl sent in -     POCT Wet + KOH Prep -     GC/Chlamydia probe amp ([place])not at [facility] for STD (sexually transmitted disease) -     GC/Chlamydia probe amp ([place])not at [facility]  Sleep disturbance- will screen  -     TSH -     CBC    No follow-ups on file.    Body mass index is  32.4 kg/m.:  Discussed the patient's BMI with patient. The BMI body mass index is 32.4 kg/m.     No future appointments.  Patient Instructions       If you have lab work done today you will be contacted with your lab results within the next 2 weeks.  If you have not heard from [place] then please contact us. The fastest way to get your results is to register for My Chart.   IF you received an x-ray today, you will receive an invoice from [facility]. Please contact [facility] at [phone] with questions or concerns regarding your invoice.   IF you received labwork today, you will receive an invoice from [facility]. Please contact [facility] at [phone] with questions or concerns regarding your invoice.   Our billing staff will not be able to assist you with questions regarding bills from these companies.  You will be contacted with the lab results as soon as they are available. The fastest way to get your results is to activate your My Chart account. Instructions are located on the last page of this paperwork. If you have not heard from [place] regarding the results in 2 weeks, please contact this office.     Insomnia Insomnia is a sleep disorder that makes it difficult to fall asleep or stay asleep. Insomnia can cause fatigue, low energy, difficulty concentrating, mood swings, and poor performance at work or school. There are three different ways to classify insomnia:  Difficulty falling asleep.  Difficulty staying asleep.  Waking up too early in the morning. Any type of insomnia can be long-term (chronic) or short-term (acute). Both are common. Short-term insomnia usually lasts for three months or less. Chronic insomnia occurs at least three times a week for longer than three months. What are the causes? Insomnia may be caused by another condition, situation, or substance, such as:  Anxiety.  Certain medicines.  Gastroesophageal reflux disease (GERD) or other  gastrointestinal conditions.  Asthma or other breathing conditions.  Restless legs syndrome, sleep apnea, or other sleep disorders.  Chronic pain.  Menopause.  Stroke.  Abuse of alcohol, tobacco, or illegal drugs.  Mental health conditions, such as depression.  Caffeine.  Neurological disorders, such as Alzheimer's disease.  An overactive thyroid (hyperthyroidism). Sometimes, the cause of insomnia may not be known. What increases the risk? Risk factors for insomnia include:  Gender. Women are affected more often than men.  Age. Insomnia is more common as you get older.  Stress.  Lack of exercise.  Irregular work schedule or working night shifts.  Traveling between different time zones.  Certain medical and mental health conditions. What are the signs or symptoms? If you have insomnia, the main symptom is having trouble falling asleep or having trouble staying asleep. This may lead to other symptoms, such as:  Feeling fatigued or having low energy.  Feeling nervous about going to sleep.  Not feeling rested in the morning.  Having trouble concentrating.  Feeling irritable, anxious, or depressed. How is this diagnosed? This condition may be diagnosed based on:  Your symptoms and medical history. Your health care provider may ask about: ? Your sleep habits. ? Any medical conditions you have. ? Your mental health.  A physical exam. How is this treated? Treatment for insomnia depends on the cause. Treatment may focus on treating an underlying condition that is causing insomnia. Treatment may also include:  Medicines to help you sleep.  Counseling or therapy.  Lifestyle adjustments to help you sleep better. Follow these instructions at home: Eating and drinking   Limit or avoid alcohol, caffeinated beverages, and cigarettes, especially close to bedtime. These can disrupt your sleep.  Do not eat a large meal or eat spicy foods right before bedtime.  This can lead to digestive discomfort that can make it hard for you to sleep. Sleep habits   Keep a sleep diary to help you and your health care provider figure out what could be causing your insomnia. Write down: ? When you sleep. ? When you wake up during the night. ? How well you sleep. ? How rested you feel the next day. ? Any side effects of medicines you are taking. ? What you eat and drink.  Make your bedroom a dark, comfortable place where it is easy to fall asleep. ? Put up shades or blackout curtains to block light from outside. ? Use a white noise machine to block noise. ? Keep the temperature cool.  Limit screen use before bedtime. This includes: ? Watching TV. ? Using your smartphone, tablet, or computer.  Stick to a routine that includes going to bed and waking up at the same times every day and night. This can help you fall asleep faster. Consider making a quiet activity, such as reading, part of your nighttime routine.  Try to avoid taking naps during the day so that you sleep better at night.  Get out of bed if you are still awake after 15 minutes of trying to sleep. Keep the lights down, but try reading or doing a quiet activity. When you feel sleepy, go back to bed. General instructions  Take over-the-counter and prescription medicines only as told by your health care provider.  Exercise regularly, as told by your health care provider. Avoid exercise starting several hours before bedtime.  Use relaxation techniques to manage stress. Ask your health care provider to suggest some techniques that may work well for you. These may include: ? Breathing exercises. ? Routines to release muscle tension. ? Visualizing peaceful scenes.  Make sure that you  drive carefully. Avoid driving if you feel very sleepy.  Keep all follow-up visits as told by your health care provider. This is important. Contact a health care provider if:  You are tired throughout the  day.  You have trouble in your daily routine due to sleepiness.  You continue to have sleep problems, or your sleep problems get worse. Get help right away if:  You have serious thoughts about hurting yourself or someone else. If you ever feel like you may hurt yourself or others, or have thoughts about taking your own life, get help right away. You can go to your nearest emergency department or call:  Your local emergency services (911 in the [place]  A suicide crisis helpline, such as the [place] at 209-219-4898. This is open 24 hours a day. Summary  Insomnia is a sleep disorder that makes it difficult to fall asleep or stay asleep.  Insomnia can be long-term (chronic) or short-term (acute).  Treatment for insomnia depends on the cause. Treatment may focus on treating an underlying condition that is causing insomnia.  Keep a sleep diary to help you and your health care provider figure out what could be causing your insomnia. This information is not intended to replace advice given to you by your health care provider. Make sure you discuss any questions you have with your health care provider. Document Revised: 06/14/2017 Document Reviewed: 04/11/2017 Elsevier Patient Education  2020 [employer].

## 2019-09-24 LAB — RPR+HSVIGM+HBSAG+HSV2(IGG)+...
HIV Screen 4th Generation wRfx: NONREACTIVE
HSV 2 IgG, Type Spec: 5.84 index — ABNORMAL HIGH (ref 0.00–0.90)
HSVI/II Comb IgM: 1.55 Ratio — ABNORMAL HIGH (ref 0.00–0.90)
Hepatitis B Surface Ag: NEGATIVE
RPR Ser Ql: NONREACTIVE

## 2019-09-24 LAB — LIPID PANEL
Chol/HDL Ratio: 5.7 ratio — ABNORMAL HIGH (ref 0.0–4.4)
Cholesterol, Total: 308 mg/dL — ABNORMAL HIGH (ref 100–199)
HDL: 54 mg/dL (ref >39)
LDL Chol Calc (NIH): 229 mg/dL — ABNORMAL HIGH (ref 0–99)
Triglycerides: 138 mg/dL (ref 0–149)
VLDL Cholesterol Cal: 25 mg/dL (ref 5–40)

## 2019-09-24 LAB — CBC
Hematocrit: 46.2 % (ref 34.0–46.6)
Hemoglobin: 15.2 g/dL (ref 11.1–15.9)
MCH: 29.1 pg (ref 26.6–33.0)
MCHC: 32.9 g/dL (ref 31.5–35.7)
MCV: 88 fL (ref 79–97)
Platelets: 445 10*3/uL (ref 150–450)
RBC: 5.23 x10E6/uL (ref 3.77–5.28)
RDW: 12.9 % (ref 11.7–15.4)
WBC: 5 10*3/uL (ref 3.4–10.8)

## 2019-09-24 LAB — TSH: TSH: 2.35 u[IU]/mL (ref 0.450–4.500)

## 2020-06-08 ENCOUNTER — Other Ambulatory Visit: Payer: Self-pay

## 2020-06-08 ENCOUNTER — Telehealth: Payer: Self-pay | Admitting: Family Medicine

## 2020-06-08 ENCOUNTER — Encounter: Payer: Self-pay | Admitting: Registered Nurse

## 2020-06-08 ENCOUNTER — Ambulatory Visit (INDEPENDENT_AMBULATORY_CARE_PROVIDER_SITE_OTHER): Payer: BC Managed Care – PPO | Admitting: Registered Nurse

## 2020-06-08 VITALS — BP 145/96 | HR 68 | Temp 98.0°F | Resp 18 | Ht 62.5 in | Wt 183.4 lb

## 2020-06-08 DIAGNOSIS — Z23 Encounter for immunization: Secondary | ICD-10-CM

## 2020-06-08 DIAGNOSIS — M545 Low back pain, unspecified: Secondary | ICD-10-CM | POA: Diagnosis not present

## 2020-06-08 MED ORDER — DICLOFENAC SODIUM 75 MG PO TBEC: 75.0000 mg | DELAYED_RELEASE_TABLET | Freq: Two times a day (BID) | ORAL | 0 refills | Status: DC

## 2020-06-08 MED ORDER — METHOCARBAMOL 500 MG PO TABS: 500.0000 mg | ORAL_TABLET | Freq: Four times a day (QID) | ORAL | 0 refills | Status: DC

## 2020-06-08 NOTE — Patient Instructions (Signed)
° ° ° °  If you have lab work done today you will be contacted with your lab results within the next 2 weeks.  If you have not heard from us then please contact us. The fastest way to get your results is to register for My Chart. ° ° °IF you received an x-ray today, you will receive an invoice from [facility]. Please contact [facility] at [phone] with questions or concerns regarding your invoice.  ° °IF you received labwork today, you will receive an invoice from [facility]. Please contact [facility] at [phone] with questions or concerns regarding your invoice.  ° °Our billing staff will not be able to assist you with questions regarding bills from these companies. ° °You will be contacted with the lab results as soon as they are available. The fastest way to get your results is to activate your My Chart account. Instructions are located on the last page of this paperwork. If you have not heard from us regarding the results in 2 weeks, please contact this office. °  ° ° ° °

## 2020-06-08 NOTE — Telephone Encounter (Signed)
Pt had an appt today 11/24 and stated that she was proscribed a Rx but it has not been sent to the pharmacy. I do not see a Rx that was proscribed today from our office in her recent meds. Please advise.

## 2020-06-13 NOTE — Telephone Encounter (Signed)
The rx was sent to pharmacy on 06/08/20

## 2020-08-02 ENCOUNTER — Encounter (INDEPENDENT_AMBULATORY_CARE_PROVIDER_SITE_OTHER): Payer: Self-pay | Admitting: Primary Care

## 2020-08-02 ENCOUNTER — Telehealth (INDEPENDENT_AMBULATORY_CARE_PROVIDER_SITE_OTHER): Payer: BC Managed Care – PPO | Admitting: Primary Care

## 2020-08-02 ENCOUNTER — Ambulatory Visit (INDEPENDENT_AMBULATORY_CARE_PROVIDER_SITE_OTHER): Payer: BC Managed Care – PPO | Admitting: Primary Care

## 2020-08-02 ENCOUNTER — Other Ambulatory Visit: Payer: Self-pay

## 2020-08-02 DIAGNOSIS — L989 Disorder of the skin and subcutaneous tissue, unspecified: Secondary | ICD-10-CM

## 2020-08-02 DIAGNOSIS — Z7689 Persons encountering health services in other specified circumstances: Secondary | ICD-10-CM | POA: Diagnosis not present

## 2020-08-02 DIAGNOSIS — G479 Sleep disorder, unspecified: Secondary | ICD-10-CM

## 2020-08-02 DIAGNOSIS — M79605 Pain in left leg: Secondary | ICD-10-CM | POA: Diagnosis not present

## 2020-08-02 DIAGNOSIS — G8929 Other chronic pain: Secondary | ICD-10-CM | POA: Diagnosis not present

## 2020-08-02 MED ORDER — METHOCARBAMOL 500 MG PO TABS: 500.0000 mg | ORAL_TABLET | Freq: Three times a day (TID) | ORAL | 1 refills | Status: DC | PRN

## 2020-08-02 MED ORDER — DICLOFENAC SODIUM 75 MG PO TBEC: 75.0000 mg | DELAYED_RELEASE_TABLET | Freq: Two times a day (BID) | ORAL | 1 refills | Status: DC
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
## 2020-08-02 NOTE — Progress Notes (Signed)
Has continued pain in left leg.  5- 9/10  Denies swelling  Knot on forehead for several months.

## 2020-08-03 ENCOUNTER — Telehealth (INDEPENDENT_AMBULATORY_CARE_PROVIDER_SITE_OTHER): Payer: Self-pay | Admitting: Primary Care

## 2020-08-03 ENCOUNTER — Other Ambulatory Visit (INDEPENDENT_AMBULATORY_CARE_PROVIDER_SITE_OTHER): Payer: Self-pay | Admitting: Primary Care

## 2020-08-03 DIAGNOSIS — M62838 Other muscle spasm: Secondary | ICD-10-CM

## 2020-08-03 MED ORDER — CYCLOBENZAPRINE HCL 10 MG PO TABS
10.0000 mg | ORAL_TABLET | Freq: Three times a day (TID) | ORAL | 0 refills | Status: DC | PRN
Start: 2020-08-03 — End: 2020-08-21

## 2020-08-03 NOTE — Telephone Encounter (Signed)
Patient called to inform the doctor that the medication she prescribed, methocarbamol (ROBAXIN) 500 MG tablet  is not covered on her insurance.  Patient would like an alternative medication.  Please call patient to confirm at [phone]

## 2020-08-15 ENCOUNTER — Encounter: Payer: Self-pay | Admitting: Registered Nurse

## 2020-08-15 NOTE — Progress Notes (Signed)
Acute Office Visit  Subjective:    Patient ID: Ashley Li, female    DOB: 09/09/1980, 40 y.o.   MRN: [id number]  Chief Complaint  Patient presents with  . Leg Pain    Patient states she has been having some left leg pain for years and now it has started back hurting. Per patient she is using ibuprofen for pain with no relief    HPI Patient is in today for lower back pain with radiation to LLE  Ongoing for years but back pain worsening No instigating event to her knowledge Denies urinary symptoms, saddle symptoms, or other red flags Taken ibuprofen but no relief  Otherwise no concerns  No past medical history on file.  No past surgical history on file.  No family history on file.  Social History   Socioeconomic History  . Marital status: Single    Spouse name: Not on file  . Number of children: Not on file  . Years of education: Not on file  . Highest education level: Not on file  Occupational History  . Not on file  Tobacco Use  . Smoking status: Never Smoker  . Smokeless tobacco: Never Used  Substance and Sexual Activity  . Alcohol use: Yes    Comment: socially   . Drug use: No  . Sexual activity: Yes    Birth control/protection: Inserts  Other Topics Concern  . Not on file  Social History Narrative  . Not on file   Social Determinants of Health   Financial Resource Strain: Not on file  Food Insecurity: Not on file  Transportation Needs: Not on file  Physical Activity: Not on file  Stress: Not on file  Social Connections: Not on file  Intimate Partner Violence: Not on file    Outpatient Medications Prior to Visit  Medication Sig Dispense Refill  . etonogestrel (NEXPLANON) 68 MG IMPL implant 1 each by Subdermal route once.    . Multiple Vitamin (MULTIVITAMIN) capsule Take 1 capsule by mouth daily.    . metroNIDAZOLE (FLAGYL) 500 MG tablet Take 1 tablet (500 mg total) by mouth 2 (two) times daily. (Patient not taking: Reported on 06/08/2020)  14 tablet 0   No facility-administered medications prior to visit.    No Known Allergies  Review of Systems  Constitutional: Negative.   HENT: Negative.   Eyes: Negative.   Respiratory: Negative.   Cardiovascular: Negative.   Gastrointestinal: Negative.   Genitourinary: Negative.   Musculoskeletal: Positive for back pain. Negative for arthralgias, gait problem, joint swelling, myalgias, neck pain and neck stiffness.  Skin: Negative.   Neurological: Negative.   Psychiatric/Behavioral: Negative.   All other systems reviewed and are negative.      Objective:    Physical Exam Vitals and nursing note reviewed.  Constitutional:      General: She is not in acute distress.    Appearance: Normal appearance. She is not ill-appearing, toxic-appearing or diaphoretic.  Cardiovascular:     Rate and Rhythm: Normal rate and regular rhythm.     Pulses: Normal pulses.     Heart sounds: Normal heart sounds. No murmur heard. No friction rub. No gallop.   Pulmonary:     Effort: Pulmonary effort is normal. No respiratory distress.     Breath sounds: Normal breath sounds. No stridor. No wheezing, rhonchi or rales.  Chest:     Chest wall: No tenderness.  Musculoskeletal:        General: Tenderness (bilateral lower back, no bony  tenderness.) present. Normal range of motion.  Skin:    General: Skin is warm and dry.     Capillary Refill: Capillary refill takes less than 2 seconds.  Neurological:     General: No focal deficit present.     Mental Status: She is alert and oriented to person, place, and time. Mental status is at baseline.  Psychiatric:        Mood and Affect: Mood normal.        Behavior: Behavior normal.        Thought Content: Thought content normal.        Judgment: Judgment normal.     BP (!) 145/96   Pulse 68   Temp 98 F (36.7 C) (Temporal)   Resp 18   Ht 5' 2.5" (1.588 m)   Wt 183 lb 6.4 oz (83.2 kg)   SpO2 99%   BMI 33.01 kg/m  Wt Readings from Last 3  Encounters:  06/08/20 183 lb 6.4 oz (83.2 kg)  09/22/19 180 lb (81.6 kg)  06/17/18 168 lb 12.8 oz (76.6 kg)    Health Maintenance Due  Topic Date Due  . PAP SMEAR-Modifier  08/20/2020    There are no preventive care reminders to display for this patient.   Lab Results  Component Value Date   TSH 2.350 09/22/2019   Lab Results  Component Value Date   WBC 5.0 09/22/2019   HGB 15.2 09/22/2019   HCT 46.2 09/22/2019   MCV 88 09/22/2019   PLT 445 09/22/2019   Lab Results  Component Value Date   NA 142 08/12/2015   K 4.2 08/12/2015   CO2 21 (L) 08/12/2015   GLUCOSE 100 (H) 08/12/2015   BUN 15 08/12/2015   CREATININE 1.14 (H) 08/12/2015   BILITOT 0.6 08/12/2015   ALKPHOS 45 08/12/2015   AST 20 08/12/2015   ALT 15 08/12/2015   PROT 6.0 (L) 08/12/2015   ALBUMIN 3.4 (L) 08/12/2015   CALCIUM 9.3 08/12/2015   ANIONGAP 9 08/12/2015   Lab Results  Component Value Date   CHOL 308 (H) 09/22/2019   Lab Results  Component Value Date   HDL 54 09/22/2019   Lab Results  Component Value Date   LDLCALC 229 (H) 09/22/2019   Lab Results  Component Value Date   TRIG 138 09/22/2019   Lab Results  Component Value Date   CHOLHDL 5.7 (H) 09/22/2019   No results found for: HGBA1C     Assessment & Plan:   Problem List Items Addressed This Visit   None   Visit Diagnoses    Flu vaccine need    -  Primary   Relevant Orders   Flu Vaccine QUAD 6+ mos PF IM (Fluarix Quad PF) (Completed)   Acute left-sided low back pain without sciatica           Meds ordered this encounter  Medications  . DISCONTD: diclofenac (VOLTAREN) 75 MG EC tablet    Sig: Take 1 tablet (75 mg total) by mouth 2 (two) times daily.    Dispense:  30 tablet    Refill:  0    Order Specific Question:   Supervising Provider    Answer:   Carlota Raspberry, JEFFREY R [[id number]]  . DISCONTD: methocarbamol (ROBAXIN) 500 MG tablet    Sig: Take 1 tablet (500 mg total) by mouth 4 (four) times daily.    Dispense:  60 tablet     Refill:  0    Order Specific Question:  Supervising Provider    Answer:   Carlota Raspberry, JEFFREY R [[id number]]   PLAN  Discussed conservative management with patient who agrees to plan  Diclofenac and methocarbamol  Will consider PT if pain is ongoing  Discussed red flags with pt who voiced understanding  I do not feel imaging is warranted at this time  Patient encouraged to call clinic with any questions, comments, or concerns.  Maximiano Coss, NP

## 2020-08-19 ENCOUNTER — Other Ambulatory Visit: Payer: Self-pay

## 2020-08-19 ENCOUNTER — Other Ambulatory Visit (INDEPENDENT_AMBULATORY_CARE_PROVIDER_SITE_OTHER): Payer: Self-pay | Admitting: Primary Care

## 2020-08-19 ENCOUNTER — Ambulatory Visit (INDEPENDENT_AMBULATORY_CARE_PROVIDER_SITE_OTHER): Payer: Medicaid Other | Admitting: Primary Care

## 2020-08-19 ENCOUNTER — Encounter (INDEPENDENT_AMBULATORY_CARE_PROVIDER_SITE_OTHER): Payer: Self-pay | Admitting: Primary Care

## 2020-08-19 VITALS — BP 138/97 | HR 92 | Resp 16 | Wt 187.0 lb

## 2020-08-19 DIAGNOSIS — M62838 Other muscle spasm: Secondary | ICD-10-CM

## 2020-08-19 DIAGNOSIS — N926 Irregular menstruation, unspecified: Secondary | ICD-10-CM

## 2020-08-19 DIAGNOSIS — R030 Elevated blood-pressure reading, without diagnosis of hypertension: Secondary | ICD-10-CM | POA: Diagnosis not present

## 2020-08-19 DIAGNOSIS — D170 Benign lipomatous neoplasm of skin and subcutaneous tissue of head, face and neck: Secondary | ICD-10-CM

## 2020-08-19 DIAGNOSIS — E782 Mixed hyperlipidemia: Secondary | ICD-10-CM | POA: Diagnosis not present

## 2020-08-19 NOTE — Progress Notes (Signed)
Established Patient Office Visit  Subjective:  Patient ID: Ashley Li, female    DOB: Jan 17, 1981  Age: 40 y.o. MRN: [id number]  CC: No chief complaint on file.   HPI Ms. Ashley Li is 89 is presents today for actual visualization for knott on her forehead. Left forehead above the eyebrow does not hurt .   History reviewed. No pertinent past medical history.  History reviewed. No pertinent surgical history.  History reviewed. No pertinent family history.  Social History   Socioeconomic History  . Marital status: Single    Spouse name: Not on file  . Number of children: Not on file  . Years of education: Not on file  . Highest education level: Not on file  Occupational History  . Not on file  Tobacco Use  . Smoking status: Never Smoker  . Smokeless tobacco: Never Used  Substance and Sexual Activity  . Alcohol use: Yes    Comment: socially   . Drug use: No  . Sexual activity: Yes    Birth control/protection: Inserts  Other Topics Concern  . Not on file  Social History Narrative  . Not on file   Social Determinants of Health   Financial Resource Strain: Not on file  Food Insecurity: Not on file  Transportation Needs: Not on file  Physical Activity: Not on file  Stress: Not on file  Social Connections: Not on file  Intimate Partner Violence: Not on file    Outpatient Medications Prior to Visit  Medication Sig Dispense Refill  . cyclobenzaprine (FLEXERIL) 10 MG tablet Take 1 tablet (10 mg total) by mouth 3 (three) times daily as needed for muscle spasms. 30 tablet 0  . diclofenac (VOLTAREN) 75 MG EC tablet Take 1 tablet (75 mg total) by mouth 2 (two) times daily. 60 tablet 1  . etonogestrel (NEXPLANON) 68 MG IMPL implant 1 each by Subdermal route once.    . Multiple Vitamin (MULTIVITAMIN) capsule Take 1 capsule by mouth daily.     No facility-administered medications prior to visit.    No Known Allergies  ROS Review of Systems  Skin:        Lipoma on forhead  All other systems reviewed and are negative.     Objective:    Physical Exam Vitals reviewed.  Constitutional:      Appearance: Normal appearance. She is obese.  HENT:     Head: Normocephalic.     Right Ear: External ear normal.     Left Ear: External ear normal.  Eyes:     Extraocular Movements: Extraocular movements intact.  Cardiovascular:     Rate and Rhythm: Normal rate and regular rhythm.  Pulmonary:     Effort: Pulmonary effort is normal.     Breath sounds: Normal breath sounds.  Abdominal:     General: Bowel sounds are normal. There is distension.     Palpations: Abdomen is soft.  Musculoskeletal:     Cervical back: Normal range of motion.  Skin:    General: Skin is warm.     Comments: Lipoma on forehead   Neurological:     Mental Status: She is alert and oriented to person, place, and time.  Psychiatric:        Mood and Affect: Mood normal.        Behavior: Behavior normal.        Thought Content: Thought content normal.        Judgment: Judgment normal.     BP Marland Kitchen)  138/97 (BP Location: Left Arm, Cuff Size: Large)   Pulse 92   Resp 16   Wt 187 lb (84.8 kg)   SpO2 100%   BMI 33.66 kg/m  Wt Readings from Last 3 Encounters:  08/19/20 187 lb (84.8 kg)  06/08/20 183 lb 6.4 oz (83.2 kg)  09/22/19 180 lb (81.6 kg)     Health Maintenance Due  Topic Date Due  . PAP SMEAR-Modifier  08/20/2020    There are no preventive care reminders to display for this patient.  Lab Results  Component Value Date   TSH 2.350 09/22/2019   Lab Results  Component Value Date   WBC 5.1 08/19/2020   HGB 13.8 08/19/2020   HCT 40.5 08/19/2020   MCV 88 08/19/2020   PLT 349 08/19/2020   Lab Results  Component Value Date   NA 136 08/19/2020   K 4.6 08/19/2020   CO2 25 08/19/2020   GLUCOSE 92 08/19/2020   BUN 14 08/19/2020   CREATININE 0.91 08/19/2020   BILITOT 0.3 08/19/2020   ALKPHOS 47 08/19/2020   AST 17 08/19/2020   ALT 15 08/19/2020    PROT 6.9 08/19/2020   ALBUMIN 4.3 08/19/2020   CALCIUM 9.8 08/19/2020   ANIONGAP 9 08/12/2015   Lab Results  Component Value Date   CHOL 294 (H) 08/19/2020   Lab Results  Component Value Date   HDL 54 08/19/2020   Lab Results  Component Value Date   LDLCALC 211 (H) 08/19/2020   Lab Results  Component Value Date   TRIG 154 (H) 08/19/2020   Lab Results  Component Value Date   CHOLHDL 5.4 (H) 08/19/2020   No results found for: HGBA1C    Assessment & Plan:  Diagnoses and all orders for this visit:  Lipoma of face Will monitor not painful or sore watch for enlargement   Irregular menstrual cycle -     CBC with Differential  Mixed hyperlipidemia Hx of elevated  cholesterol will check labs . Made aware elevated cholesterol can lead to heart attack and stroke.  -     Lipid Panel  Elevated blood pressure reading Diet and lifestyle changes can help you prevent hypertension, and they may make you feel better overall and improve your quality of life. On AVS information prevented to prevent HTN -     CMP14+EGFR   Follow-up: Return in about 3 months (around 11/16/2020) for Bp f/u.    Kerin Perna, NP

## 2020-08-19 NOTE — Patient Instructions (Addendum)
Lipoma  A lipoma is a noncancerous (benign) tumor that is made up of fat cells. This is a very common type of soft-tissue growth. Lipomas are usually found under the skin (subcutaneous). They may occur in any tissue of the body that contains fat. Common areas for lipomas to appear include the back, arms, shoulders, buttocks, and thighs. Lipomas grow slowly, and they are usually painless. Most lipomas do not cause problems and do not require treatment. What are the causes? The cause of this condition is not known. What increases the risk? You are more likely to develop this condition if:  You are 32-40 years old.  You have a family history of lipomas. What are the signs or symptoms? A lipoma usually appears as a small, round bump under the skin. In most cases, the lump will:  Feel soft or rubbery.  Not cause pain or other symptoms. However, if a lipoma is located in an area where it pushes on nerves, it can become painful or cause other symptoms. How is this diagnosed? A lipoma can usually be diagnosed with a physical exam. You may also have tests to confirm the diagnosis and to rule out other conditions. Tests may include:  Imaging tests, such as a CT scan or an MRI.  Removal of a tissue sample to be looked at under a microscope (biopsy). How is this treated? Treatment for this condition depends on the size of the lipoma and whether it is causing any symptoms.  For small lipomas that are not causing problems, no treatment is needed.  If a lipoma is bigger or it causes problems, surgery may be done to remove the lipoma. Lipomas can also be removed to improve appearance. Most often, the procedure is done after applying a medicine that numbs the area (local anesthetic).  Liposuction may be done to reduce the size of the lipoma before it is removed through surgery, or it may be done to remove the lipoma. Lipomas are removed with this method in order to limit incision size and scarring. A  liposuction tube is inserted through a small incision into the lipoma, and the contents of the lipoma are removed through the tube with suction. Follow these instructions at home:  Watch your lipoma for any changes.  Keep all follow-up visits as told by your health care provider. This is important. Contact a health care provider if:  Your lipoma becomes larger or hard.  Your lipoma becomes painful, red, or increasingly swollen. These could be signs of infection or a more serious condition. Get help right away if:  You develop tingling or numbness in an area near the lipoma. This could indicate that the lipoma is causing nerve damage. Summary  A lipoma is a noncancerous tumor that is made up of fat cells.  Most lipomas do not cause problems and do not require treatment.  If a lipoma is bigger or it causes problems, surgery may be done to remove the lipoma.  Contact a health care provider if your lipoma becomes larger or hard, or if it becomes painful, red, or increasingly swollen. Pain, redness, and swelling could be signs of infection or a more serious condition. This information is not intended to replace advice given to you by your health care provider. Make sure you discuss any  Preventing Hypertension Hypertension, also called high blood pressure, is when the force of blood pumping through the arteries is too strong. Arteries are blood vessels that carry blood from the heart throughout the body.  Often, hypertension does not cause symptoms until blood pressure is very high. It is important to have your blood pressure checked regularly. Diet and lifestyle changes can help you prevent hypertension, and they may make you feel better overall and improve your quality of life. If you already have hypertension, you may control it with diet and lifestyle changes, as well as with medicine. How can this condition affect me? Over time, hypertension can damage the arteries and decrease blood flow  to important parts of the body, including the brain, heart, and kidneys. By keeping your blood pressure in a healthy range, you can help prevent complications like heart attack, heart failure, stroke, kidney failure, and vascular dementia. What can increase my risk?  Being an older adult. Older people are more often affected.  Having family members who have had high blood pressure.  Being obese.  Being female. Males are more likely to have high blood pressure.  Drinking too much alcohol or caffeine.  Smoking or using illegal drugs.  Taking certain medicines, such as antidepressants, decongestants, birth control pills, and NSAIDs, such as ibuprofen.  Having thyroid problems.  Having certain tumors. What actions can I take to prevent or manage this condition? Work with your health care provider to make a hypertension prevention plan that works for you. Follow your plan and keep all follow-up visits as told by your health care provider. Diet changes Maintain a healthy diet. This includes:  Eating less salt (sodium). Ask your health care provider how much sodium is safe for you to have. The general recommendation is to have less than 1 tsp (2,300 mg) of sodium a day. ? Do not add salt to your food. ? Choose low-sodium options when grocery shopping and eating out.  Limiting fats in your diet. You can do this by eating low-fat or fat-free dairy products and by eating less red meat.  Eating more fruits, vegetables, and whole grains. Make a goal to eat: ? 1-2 cups of fresh fruits and vegetables each day. ? 3-4 servings of whole grains each day.  Avoiding foods and beverages that have added sugars.  Eating fish that contain healthy fats (omega-3 fatty acids), such as mackerel or salmon. If you need help putting together a healthy eating plan, try the DASH diet. This diet is high in fruits, vegetables, and whole grains. It is low in sodium, red meat, and added sugars. DASH stands for  Dietary Approaches to Stop Hypertension.   Lifestyle changes Lose weight if you are overweight. Losing just 3?5% of your body weight can help prevent or control hypertension. For example, if your present weight is 200 lb (91 kg), a loss of 3-5% of your weight means losing 6-10 lb (2.7-4.5 kg). Ask your health care provider to help you with a diet and exercise plan to safely lose weight. Other recommendations usually include:  Get enough exercise. Do at least 150 minutes of moderate-intensity exercise each week. You could do this in short exercise sessions several times a day, or you could do longer exercise sessions a few times a week. For example, you could take a brisk 10-minute walk or bike ride, 3 times a day, for 5 days a week.  Find ways to reduce stress, such as exercising, meditating, listening to music, or taking a yoga class. If you need help reducing stress, ask your health care provider.  Do not use any products that contain nicotine or tobacco, such as cigarettes, e-cigarettes, and chewing tobacco. If you need help  quitting, ask your health care provider. Chemicals in tobacco and nicotine products raise your blood pressure each time you use them. If you need help quitting, ask your health care provider.  Learn how to check your blood pressure at home. Make sure that you know your personal target blood pressure, as told by your health care provider.  Try to sleep 7-9 hours per night.   Alcohol use  Do not drink alcohol if: ? Your health care provider tells you not to drink. ? You are pregnant, may be pregnant, or are planning to become pregnant.  If you drink alcohol: ? Limit how much you use to:  0-1 drink a day for women.  0-2 drinks a day for men. ? Be aware of how much alcohol is in your drink. In the [place] one drink equals one 12 oz bottle of beer (355 mL), one 5 oz glass of wine (148 mL), or one 1 oz glass of hard liquor (44 mL). Medicines In addition to diet and  lifestyle changes, your health care provider may recommend medicines to help lower your blood pressure. In general:  You may need to try a few different medicines to find what works best for you.  You may need to take more than one medicine.  Take over-the-counter and prescription medicines only as told by your health care provider. Questions to ask your health care provider  What is my blood pressure goal?  How can I lower my risk for high blood pressure?  How should I monitor my blood pressure at home? Where to find support Your health care provider can help you prevent hypertension and help you keep your blood pressure at a healthy level. Your local hospital or your community may also provide support services and prevention programs. The American Heart Association offers an online support network at supportnetwork.heart.org Where to find more information Learn more about hypertension from:  [place], Lung, and [place]: https://wilson-eaton.com/  Centers for Disease Control and Prevention: http://www.wolf.info/  American Academy of Family Physicians: familydoctor.org Learn more about the DASH diet from:  [place], Lung, and [place]: https://wilson-eaton.com/ Contact a health care provider if:  You think you are having a reaction to medicines you have taken.  You have recurrent headaches or feel dizzy.  You have swelling in your ankles.  You have trouble with your vision. Get help right away if:  You have sudden, severe chest, back, or abdominal pain or discomfort.  You have shortness of breath.  You have a sudden, severe headache. These symptoms may represent a serious problem that is an emergency. Do not wait to see if the symptoms will go away. Get medical help right away. Call your local emergency services (911 in the [place] Do not drive yourself to the hospital.  Summary  Hypertension often does not cause any symptoms until blood pressure is very high. It is  important to get your blood pressure checked regularly.  Diet and lifestyle changes are important steps in preventing hypertension.  By keeping your blood pressure in a healthy range, you may prevent complications like heart attack, heart failure, stroke, and kidney failure.  Work with your health care provider to make a hypertension prevention plan that works for you. This information is not intended to replace advice given to you by your health care provider. Make sure you discuss any questions you have with your health care provider. Document Revised: 06/02/2019 Document Reviewed: 06/02/2019 Elsevier Patient Education  2021 [employer]. questions  you have with your health care provider. Document Revised: 02/16/2019 Document Reviewed: 02/16/2019 Elsevier Patient Education  [place].

## 2020-08-19 NOTE — Progress Notes (Signed)
Concerns about a knot on forehead. It does not hurt. It's been there for a several months.

## 2020-08-20 LAB — CBC WITH DIFFERENTIAL/PLATELET
Basophils Absolute: 0 10*3/uL (ref 0.0–0.2)
Basos: 1 %
EOS (ABSOLUTE): 0.2 10*3/uL (ref 0.0–0.4)
Eos: 4 %
Hematocrit: 40.5 % (ref 34.0–46.6)
Hemoglobin: 13.8 g/dL (ref 11.1–15.9)
Immature Grans (Abs): 0 10*3/uL (ref 0.0–0.1)
Immature Granulocytes: 0 %
Lymphocytes Absolute: 2 10*3/uL (ref 0.7–3.1)
Lymphs: 40 %
MCH: 30.1 pg (ref 26.6–33.0)
MCHC: 34.1 g/dL (ref 31.5–35.7)
MCV: 88 fL (ref 79–97)
Monocytes Absolute: 0.4 10*3/uL (ref 0.1–0.9)
Monocytes: 8 %
Neutrophils Absolute: 2.4 10*3/uL (ref 1.4–7.0)
Neutrophils: 47 %
Platelets: 349 10*3/uL (ref 150–450)
RBC: 4.58 x10E6/uL (ref 3.77–5.28)
RDW: 12.3 % (ref 11.7–15.4)
WBC: 5.1 10*3/uL (ref 3.4–10.8)

## 2020-08-20 LAB — CMP14+EGFR
ALT: 15 IU/L (ref 0–32)
AST: 17 IU/L (ref 0–40)
Albumin/Globulin Ratio: 1.7 (ref 1.2–2.2)
Albumin: 4.3 g/dL (ref 3.8–4.8)
Alkaline Phosphatase: 47 IU/L (ref 44–121)
BUN/Creatinine Ratio: 15 (ref 9–23)
BUN: 14 mg/dL (ref 6–20)
Bilirubin Total: 0.3 mg/dL (ref 0.0–1.2)
CO2: 25 mmol/L (ref 20–29)
Calcium: 9.8 mg/dL (ref 8.7–10.2)
Chloride: 100 mmol/L (ref 96–106)
Creatinine, Ser: 0.91 mg/dL (ref 0.57–1.00)
GFR calc Af Amer: 93 mL/min/{1.73_m2} (ref >59)
GFR calc non Af Amer: 81 mL/min/{1.73_m2} (ref >59)
Globulin, Total: 2.6 g/dL (ref 1.5–4.5)
Glucose: 92 mg/dL (ref 65–99)
Potassium: 4.6 mmol/L (ref 3.5–5.2)
Sodium: 136 mmol/L (ref 134–144)
Total Protein: 6.9 g/dL (ref 6.0–8.5)

## 2020-08-20 LAB — LIPID PANEL
Chol/HDL Ratio: 5.4 ratio — ABNORMAL HIGH (ref 0.0–4.4)
Cholesterol, Total: 294 mg/dL — ABNORMAL HIGH (ref 100–199)
HDL: 54 mg/dL (ref >39)
LDL Chol Calc (NIH): 211 mg/dL — ABNORMAL HIGH (ref 0–99)
Triglycerides: 154 mg/dL — ABNORMAL HIGH (ref 0–149)
VLDL Cholesterol Cal: 29 mg/dL (ref 5–40)

## 2020-08-21 ENCOUNTER — Other Ambulatory Visit (INDEPENDENT_AMBULATORY_CARE_PROVIDER_SITE_OTHER): Payer: Self-pay | Admitting: Primary Care

## 2020-08-21 DIAGNOSIS — M62838 Other muscle spasm: Secondary | ICD-10-CM

## 2020-08-22 ENCOUNTER — Other Ambulatory Visit (INDEPENDENT_AMBULATORY_CARE_PROVIDER_SITE_OTHER): Payer: Self-pay | Admitting: Primary Care

## 2020-08-22 DIAGNOSIS — E782 Mixed hyperlipidemia: Secondary | ICD-10-CM

## 2020-08-22 MED ORDER — CYCLOBENZAPRINE HCL 10 MG PO TABS: 10.0000 mg | ORAL_TABLET | Freq: Three times a day (TID) | ORAL | 0 refills | Status: DC | PRN

## 2020-08-22 MED ORDER — ATORVASTATIN CALCIUM 40 MG PO TABS: 40.0000 mg | ORAL_TABLET | Freq: Every day | ORAL | 3 refills | Status: DC

## 2020-08-22 NOTE — Telephone Encounter (Signed)
Request has been sent to PCP to refill if appropriate.

## 2020-09-06 DIAGNOSIS — Z113 Encounter for screening for infections with a predominantly sexual mode of transmission: Secondary | ICD-10-CM | POA: Diagnosis not present

## 2020-09-06 DIAGNOSIS — Z114 Encounter for screening for human immunodeficiency virus [HIV]: Secondary | ICD-10-CM | POA: Diagnosis not present

## 2020-09-28 ENCOUNTER — Other Ambulatory Visit (INDEPENDENT_AMBULATORY_CARE_PROVIDER_SITE_OTHER): Payer: Self-pay | Admitting: Primary Care

## 2020-09-28 DIAGNOSIS — G8929 Other chronic pain: Secondary | ICD-10-CM

## 2020-09-28 NOTE — Telephone Encounter (Signed)
Requested Prescriptions  Pending Prescriptions Disp Refills  . diclofenac (VOLTAREN) 75 MG EC tablet [Pharmacy Med Name: DICLOFENAC SODIUM 75MG  DR TABLETS] 60 tablet 1    Sig: TAKE 1 TABLET(75 MG) BY MOUTH TWICE DAILY     Analgesics:  NSAIDS Passed - 09/28/2020  3:24 AM      Passed - Cr in normal range and within 360 days    Creatinine, Ser  Date Value Ref Range Status  08/19/2020 0.91 0.57 - 1.00 mg/dL Final         Passed - HGB in normal range and within 360 days    Hemoglobin  Date Value Ref Range Status  08/19/2020 13.8 11.1 - 15.9 g/dL Final         Passed - Patient is not pregnant      Passed - Valid encounter within last 12 months    Recent Outpatient Visits          1 month ago Lipoma of face   [facility], [place], NP   1 month ago Encounter to establish care   [place], [place], NP   3 months ago Flu vaccine need   [facility], Delfino Lovett, NP   1 year ago Encounter for health maintenance examination in adult   [facility], MD   2 years ago Screen for STD (sexually transmitted disease)   [facility], Ranell Patrick, MD      Future Appointments            In 1 month Oletta Lamas [place] Cage, NP [place]

## 2020-10-17 ENCOUNTER — Encounter: Payer: BC Managed Care – PPO | Admitting: Family Medicine

## 2020-11-07 ENCOUNTER — Other Ambulatory Visit (INDEPENDENT_AMBULATORY_CARE_PROVIDER_SITE_OTHER): Payer: Self-pay | Admitting: Primary Care

## 2020-11-07 ENCOUNTER — Telehealth (INDEPENDENT_AMBULATORY_CARE_PROVIDER_SITE_OTHER): Payer: Self-pay | Admitting: Primary Care

## 2020-11-07 DIAGNOSIS — M62838 Other muscle spasm: Secondary | ICD-10-CM

## 2020-11-07 NOTE — Telephone Encounter (Signed)
Pt is calling regarding the left leg pain that was discuss at the previous appt. Pt states that she was given cyclobenzaprine (FLEXERIL) 10 MG tablet [[id number]]  The medication did help. Pt wants to know what michelle thinks about a refill ? Pt will be in for an appt 11/16/20. Preferred Pharmacy - [facility] [address] [phone]

## 2020-11-08 ENCOUNTER — Other Ambulatory Visit (INDEPENDENT_AMBULATORY_CARE_PROVIDER_SITE_OTHER): Payer: Self-pay | Admitting: Primary Care

## 2020-11-08 DIAGNOSIS — M62838 Other muscle spasm: Secondary | ICD-10-CM

## 2020-11-08 NOTE — Telephone Encounter (Signed)
Sent to PCP to refill if appropriate.  

## 2020-11-09 NOTE — Telephone Encounter (Signed)
Sent to PCP to refill if appropriate.  

## 2020-11-10 ENCOUNTER — Other Ambulatory Visit (INDEPENDENT_AMBULATORY_CARE_PROVIDER_SITE_OTHER): Payer: Self-pay | Admitting: Primary Care

## 2020-11-10 NOTE — Telephone Encounter (Signed)
Sent to PCP to refill if appropriate.  

## 2020-11-11 ENCOUNTER — Other Ambulatory Visit (INDEPENDENT_AMBULATORY_CARE_PROVIDER_SITE_OTHER): Payer: Self-pay | Admitting: Primary Care

## 2020-11-11 DIAGNOSIS — M62838 Other muscle spasm: Secondary | ICD-10-CM

## 2020-11-11 MED ORDER — CYCLOBENZAPRINE HCL 10 MG PO TABS: 10.0000 mg | ORAL_TABLET | Freq: Three times a day (TID) | ORAL | 0 refills | Status: DC | PRN

## 2020-11-16 ENCOUNTER — Ambulatory Visit (INDEPENDENT_AMBULATORY_CARE_PROVIDER_SITE_OTHER): Payer: BC Managed Care – PPO | Admitting: Primary Care

## 2020-11-16 ENCOUNTER — Encounter (INDEPENDENT_AMBULATORY_CARE_PROVIDER_SITE_OTHER): Payer: Self-pay

## 2020-11-16 ENCOUNTER — Other Ambulatory Visit (HOSPITAL_COMMUNITY)
Admission: RE | Admit: 2020-11-16 | Discharge: 2020-11-16 | Disposition: A | Discharge status: Home / Self Care | Payer: BC Managed Care – PPO | Source: Ambulatory Visit | Attending: Primary Care | Admitting: Primary Care

## 2020-11-16 ENCOUNTER — Other Ambulatory Visit: Payer: Self-pay

## 2020-11-16 VITALS — BP 145/96 | HR 100 | Temp 97.5°F | Ht 62.5 in | Wt 185.6 lb

## 2020-11-16 DIAGNOSIS — M62838 Other muscle spasm: Secondary | ICD-10-CM | POA: Diagnosis not present

## 2020-11-16 DIAGNOSIS — Z124 Encounter for screening for malignant neoplasm of cervix: Secondary | ICD-10-CM

## 2020-11-16 DIAGNOSIS — Z0000 Encounter for general adult medical examination without abnormal findings: Secondary | ICD-10-CM

## 2020-11-16 DIAGNOSIS — N898 Other specified noninflammatory disorders of vagina: Secondary | ICD-10-CM

## 2020-11-16 DIAGNOSIS — Z0001 Encounter for general adult medical examination with abnormal findings: Secondary | ICD-10-CM | POA: Diagnosis not present

## 2020-11-16 DIAGNOSIS — I10 Essential (primary) hypertension: Secondary | ICD-10-CM | POA: Diagnosis not present

## 2020-11-16 MED ORDER — HYDROCHLOROTHIAZIDE 25 MG PO TABS: 25.0000 mg | ORAL_TABLET | Freq: Every day | ORAL | 3 refills | Status: DC

## 2020-11-16 MED ORDER — CYCLOBENZAPRINE HCL 10 MG PO TABS: 10.0000 mg | ORAL_TABLET | Freq: Three times a day (TID) | ORAL | 0 refills | Status: DC | PRN

## 2020-11-16 NOTE — Patient Instructions (Addendum)
Hypertension, Adult Hypertension is another name for high blood pressure. High blood pressure forces your heart to work harder to pump blood. This can cause problems over time. There are two numbers in a blood pressure reading. There is a top number (systolic) over a bottom number (diastolic). It is best to have a blood pressure that is below 120/80. Healthy choices can help lower your blood pressure, or you may need medicine to help lower it. What are the causes? The cause of this condition is not known. Some conditions may be related to high blood pressure. What increases the risk?  Smoking.  Having type 2 diabetes mellitus, high cholesterol, or both.  Not getting enough exercise or physical activity.  Being overweight.  Having too much fat, sugar, calories, or salt (sodium) in your diet.  Drinking too much alcohol.  Having long-term (chronic) kidney disease.  Having a family history of high blood pressure.  Age. Risk increases with age.  Race. You may be at higher risk if you are African American.  Gender. Men are at higher risk than women before age 45. After age 65, women are at higher risk than men.  Having obstructive sleep apnea.  Stress. What are the signs or symptoms?  High blood pressure may not cause symptoms. Very high blood pressure (hypertensive crisis) may cause: ? Headache. ? Feelings of worry or nervousness (anxiety). ? Shortness of breath. ? Nosebleed. ? A feeling of being sick to your stomach (nausea). ? Throwing up (vomiting). ? Changes in how you see. ? Very bad chest pain. ? Seizures. How is this treated?  This condition is treated by making healthy lifestyle changes, such as: ? Eating healthy foods. ? Exercising more. ? Drinking less alcohol.  Your health care provider may prescribe medicine if lifestyle changes are not enough to get your blood pressure under control, and if: ? Your top number is above 130. ? Your bottom number is above  80.  Your personal target blood pressure may vary. Follow these instructions at home: Eating and drinking  If told, follow the DASH eating plan. To follow this plan: ? Fill one half of your plate at each meal with fruits and vegetables. ? Fill one fourth of your plate at each meal with whole grains. Whole grains include whole-wheat pasta, brown rice, and whole-grain bread. ? Eat or drink low-fat dairy products, such as skim milk or low-fat yogurt. ? Fill one fourth of your plate at each meal with low-fat (lean) proteins. Low-fat proteins include fish, chicken without skin, eggs, beans, and tofu. ? Avoid fatty meat, cured and processed meat, or chicken with skin. ? Avoid pre-made or processed food.  Eat less than 1,500 mg of salt each day.  Do not drink alcohol if: ? Your doctor tells you not to drink. ? You are pregnant, may be pregnant, or are planning to become pregnant.  If you drink alcohol: ? Limit how much you use to:  0-1 drink a day for women.  0-2 drinks a day for men. ? Be aware of how much alcohol is in your drink. In the [place] one drink equals one 12 oz bottle of beer (355 mL), one 5 oz glass of wine (148 mL), or one 1 oz glass of hard liquor (44 mL).   Lifestyle  Work with your doctor to stay at a healthy weight or to lose weight. Ask your doctor what the best weight is for you.  Get at least 30 minutes of exercise most   days of the week. This may include walking, swimming, or biking.  Get at least 30 minutes of exercise that strengthens your muscles (resistance exercise) at least 3 days a week. This may include lifting weights or doing Pilates.  Do not use any products that contain nicotine or tobacco, such as cigarettes, e-cigarettes, and chewing tobacco. If you need help quitting, ask your doctor.  Check your blood pressure at home as told by your doctor.  Keep all follow-up visits as told by your doctor. This is important.   Medicines  Take over-the-counter  and prescription medicines only as told by your doctor. Follow directions carefully.  Do not skip doses of blood pressure medicine. The medicine does not work as well if you skip doses. Skipping doses also puts you at risk for problems.  Ask your doctor about side effects or reactions to medicines that you should watch for. Contact a doctor if you:  Think you are having a reaction to the medicine you are taking.  Have headaches that keep coming back (recurring).  Feel dizzy.  Have swelling in your ankles.  Have trouble with your vision. Get help right away if you:  Get a very bad headache.  Start to feel mixed up (confused).  Feel weak or numb.  Feel faint.  Have very bad pain in your: ? Chest. ? Belly (abdomen).  Throw up more than once.  Have trouble breathing. Summary  Hypertension is another name for high blood pressure.  High blood pressure forces your heart to work harder to pump blood.  For most people, a normal blood pressure is less than 120/80.  Making healthy choices can help lower blood pressure. If your blood pressure does not get lower with healthy choices, you may need to take medicine. This information is not intended to replace advice given to you by your health care provider. Make sure you discuss any questions you have with your health care provider. Document Revised: 03/12/2018 Document Reviewed: 03/12/2018 Elsevier Patient Education  2021 [employer] Health Maintenance, Female Adopting a healthy lifestyle and getting preventive care are important in promoting health and wellness. Ask your health care provider about:  The right schedule for you to have regular tests and exams.  Things you can do on your own to prevent diseases and keep yourself healthy. What should I know about diet, weight, and exercise? Eat a healthy diet  Eat a diet that includes plenty of vegetables, fruits, low-fat dairy products, and lean protein.  Do not eat a lot  of foods that are high in solid fats, added sugars, or sodium.   Maintain a healthy weight Body mass index (BMI) is used to identify weight problems. It estimates body fat based on height and weight. Your health care provider can help determine your BMI and help you achieve or maintain a healthy weight. Get regular exercise Get regular exercise. This is one of the most important things you can do for your health. Most adults should:  Exercise for at least 150 minutes each week. The exercise should increase your heart rate and make you sweat (moderate-intensity exercise).  Do strengthening exercises at least twice a week. This is in addition to the moderate-intensity exercise.  Spend less time sitting. Even light physical activity can be beneficial. Watch cholesterol and blood lipids Have your blood tested for lipids and cholesterol at 38 years of age, then have this test every 5 years. Have your cholesterol levels checked more often if:  Your lipid or   cholesterol levels are high.  You are older than 38 years of age.  You are at high risk for heart disease. What should I know about cancer screening? Depending on your health history and family history, you may need to have cancer screening at various ages. This may include screening for:  Breast cancer.  Cervical cancer.  Colorectal cancer.  Skin cancer.  Lung cancer. What should I know about heart disease, diabetes, and high blood pressure? Blood pressure and heart disease  High blood pressure causes heart disease and increases the risk of stroke. This is more likely to develop in people who have high blood pressure readings, are of African descent, or are overweight.  Have your blood pressure checked: ? Every 3-5 years if you are 18-39 years of age. ? Every year if you are 40 years old or older. Diabetes Have regular diabetes screenings. This checks your fasting blood sugar level. Have the screening done:  Once every three  years after age 40 if you are at a normal weight and have a low risk for diabetes.  More often and at a younger age if you are overweight or have a high risk for diabetes. What should I know about preventing infection? Hepatitis B If you have a higher risk for hepatitis B, you should be screened for this virus. Talk with your health care provider to find out if you are at risk for hepatitis B infection. Hepatitis C Testing is recommended for:  Everyone born from 1945 through 1965.  Anyone with known risk factors for hepatitis C. Sexually transmitted infections (STIs)  Get screened for STIs, including gonorrhea and chlamydia, if: ? You are sexually active and are younger than 38 years of age. ? You are older than 38 years of age and your health care provider tells you that you are at risk for this type of infection. ? Your sexual activity has changed since you were last screened, and you are at increased risk for chlamydia or gonorrhea. Ask your health care provider if you are at risk.  Ask your health care provider about whether you are at high risk for HIV. Your health care provider may recommend a prescription medicine to help prevent HIV infection. If you choose to take medicine to prevent HIV, you should first get tested for HIV. You should then be tested every 3 months for as long as you are taking the medicine. Pregnancy  If you are about to stop having your period (premenopausal) and you may become pregnant, seek counseling before you get pregnant.  Take 400 to 800 micrograms (mcg) of folic acid every day if you become pregnant.  Ask for birth control (contraception) if you want to prevent pregnancy. Osteoporosis and menopause Osteoporosis is a disease in which the bones lose minerals and strength with aging. This can result in bone fractures. If you are 65 years old or older, or if you are at risk for osteoporosis and fractures, ask your health care provider if you should:  Be  screened for bone loss.  Take a calcium or vitamin D supplement to lower your risk of fractures.  Be given hormone replacement therapy (HRT) to treat symptoms of menopause. Follow these instructions at home: Lifestyle  Do not use any products that contain nicotine or tobacco, such as cigarettes, e-cigarettes, and chewing tobacco. If you need help quitting, ask your health care provider.  Do not use street drugs.  Do not share needles.  Ask your health care provider for   help if you need support or information about quitting drugs. Alcohol use  Do not drink alcohol if: ? Your health care provider tells you not to drink. ? You are pregnant, may be pregnant, or are planning to become pregnant.  If you drink alcohol: ? Limit how much you use to 0-1 drink a day. ? Limit intake if you are breastfeeding.  Be aware of how much alcohol is in your drink. In the [place] one drink equals one 12 oz bottle of beer (355 mL), one 5 oz glass of wine (148 mL), or one 1 oz glass of hard liquor (44 mL). General instructions  Schedule regular health, dental, and eye exams.  Stay current with your vaccines.  Tell your health care provider if: ? You often feel depressed. ? You have ever been abused or do not feel safe at home. Summary  Adopting a healthy lifestyle and getting preventive care are important in promoting health and wellness.  Follow your health care provider's instructions about healthy diet, exercising, and getting tested or screened for diseases.  Follow your health care provider's instructions on monitoring your cholesterol and blood pressure. This information is not intended to replace advice given to you by your health care provider. Make sure you discuss any questions you have with your health care provider. Document Revised: 06/25/2018 Document Reviewed: 06/25/2018 Elsevier Patient Education  2021 Elsevier Inc.  

## 2020-11-16 NOTE — Progress Notes (Signed)
  [facility]  WELL-WOMAN PHYSICAL & PAP Patient name: Ashley Li MRN [id number]  Date of birth: 08-09-1980 Chief Complaint:   Pap  History of Present Illness:   Ashley Li is a 40 y.o. No obstetric history on file. African American female being seen today for a routine well-woman exam.  Current complaints: Vaginal discharge and muscle spasm   PCP: Kerin Perna   does not desire labs No LMP recorded. Patient has had an implant. The current method of family planning is Nexplanon.  Last pap . Results were: abnormal  HPV  Review of Systems:   Pertinent items are noted in HPI Denies any headaches, blurred vision, fatigue, shortness of breath, chest pain, abdominal pain, abnormal vaginal discharge/itching/odor/irritation, problems with periods, bowel movements, urination, or intercourse unless otherwise stated above. Pertinent History Reviewed:  Reviewed past medical,surgical, social and family history.  Reviewed problem list, medications and allergies. Physical Assessment:   Vitals:   11/16/20 0914 11/16/20 0926  BP: (!) 158/102 (!) 145/96  Pulse: (!) 112 100  Temp: (!) 97.5 F (36.4 C)   TempSrc: Temporal   SpO2: 97%   Weight: 185 lb 9.6 oz (84.2 kg)   Height: 5' 2.5" (1.588 m)   Body mass index is 33.41 kg/m.        Physical Examination:   General appearance - well appearing, and in no distress  Mental status - alert, oriented to person, place, and time  Psych:  She has a normal mood and affect  Skin - warm and dry, normal color, no suspicious lesions noted  Chest - effort normal, all lung fields clear to auscultation bilaterally  Heart - normal rate and regular rhythm  Neck:  midline trachea, no thyromegaly or nodules  Breasts - breasts appear normal, no suspicious masses, no skin or nipple changes or axillary nodes  Abdomen - soft, nontender, nondistended, no masses or organomegaly  Pelvic - VULVA: normal appearing vulva with no masses,  tenderness or lesions  VAGINA: normal appearing vagina with normal color and discharge, no lesions  CERVIX: normal appearing cervix without discharge or lesions, no CMT  Thin prep pap is done with  HPV cotesting  UTERUS: uterus is felt to be normal size, shape, consistency and nontender   ADNEXA: No adnexal masses or tenderness noted.   Extremities:  No swelling or varicosities noted  No results found for this or any previous visit (from the past 24 hour(s)).  Assessment & Plan:   Ashley Li was seen today for annual exam.  Diagnoses and all orders for this visit:  Screening for cervical cancer Recommendations screening of cervical cancer every 3 years with cervical cytology.  -     Cytology - PAP([facility])  Vaginal discharge -     Cervicovaginal ancillary only  Essential hypertension Counseled on blood pressure goal of less than 130/80, low-sodium, DASH diet, medication compliance, 150 minutes of moderate intensity exercise per week. Discussed medication compliance, adverse effects. -     hydrochlorothiazide (HYDRODIURIL) 25 MG tablet; Take 1 tablet (25 mg total) by mouth daily.  Muscle spasm -     cyclobenzaprine (FLEXERIL) 10 MG tablet; Take 1 tablet (10 mg total) by mouth 3 (three) times daily as needed for muscle spasms.   Follow-up: Return in about 2 weeks (around 11/30/2020) for BP f/u/ left leg pain .  Kerin Perna 11/17/2020 5:39 PM

## 2020-11-17 ENCOUNTER — Encounter (INDEPENDENT_AMBULATORY_CARE_PROVIDER_SITE_OTHER): Payer: Self-pay | Admitting: Primary Care

## 2020-11-17 LAB — CYTOLOGY - PAP: Diagnosis: NEGATIVE

## 2020-11-18 LAB — CERVICOVAGINAL ANCILLARY ONLY
Bacterial Vaginitis (gardnerella): POSITIVE — AB
Candida Glabrata: NEGATIVE
Candida Vaginitis: NEGATIVE
Chlamydia: NEGATIVE
Comment: NEGATIVE
Comment: NEGATIVE
Comment: NEGATIVE
Comment: NEGATIVE
Comment: NEGATIVE
Comment: NORMAL
Neisseria Gonorrhea: NEGATIVE
Trichomonas: NEGATIVE

## 2020-11-21 ENCOUNTER — Other Ambulatory Visit (INDEPENDENT_AMBULATORY_CARE_PROVIDER_SITE_OTHER): Payer: Self-pay | Admitting: Primary Care

## 2020-11-21 DIAGNOSIS — B9689 Other specified bacterial agents as the cause of diseases classified elsewhere: Secondary | ICD-10-CM

## 2020-11-21 MED ORDER — METRONIDAZOLE 500 MG PO TABS: 500.0000 mg | ORAL_TABLET | Freq: Two times a day (BID) | ORAL | 0 refills | Status: DC

## 2020-11-30 ENCOUNTER — Telehealth (INDEPENDENT_AMBULATORY_CARE_PROVIDER_SITE_OTHER): Payer: BC Managed Care – PPO | Admitting: Primary Care

## 2020-11-30 ENCOUNTER — Other Ambulatory Visit: Payer: Self-pay

## 2020-11-30 DIAGNOSIS — M545 Low back pain, unspecified: Secondary | ICD-10-CM

## 2020-11-30 DIAGNOSIS — G8929 Other chronic pain: Secondary | ICD-10-CM | POA: Diagnosis not present

## 2020-11-30 DIAGNOSIS — I10 Essential (primary) hypertension: Secondary | ICD-10-CM

## 2020-11-30 MED ORDER — MELOXICAM 7.5 MG PO TABS: 7.5000 mg | ORAL_TABLET | Freq: Every day | ORAL | 0 refills | Status: DC

## 2020-11-30 NOTE — Progress Notes (Signed)
  [facility]  Telephone Note  I connected with Ashley Li, on 11/30/2020 at 4:56 PM by telephone due to the COVID-19 pandemic and verified that I am speaking with the correct person using two identifiers.   Consent: I discussed the limitations, risks, security and privacy concerns of performing an evaluation and management service by telephone and the availability of in person appointments. I also discussed with the patient that there may be a patient responsible charge related to this service. The patient expressed understanding and agreed to proceed.   Location of Patient: Office - left had a call to pick up her child  Location of Provider: [facility]   Persons participating in Telemedicine visit: Debby Freiberg,  NP  History of Present Illness: Ms. Ashley Li is a 40 year old female who presented today for blood pressure follow-up and compliant pain left hip that radiates to her ankle. Also, c/o insomnia.   No past medical history on file. No Known Allergies  Current Outpatient Medications on File Prior to Visit  Medication Sig Dispense Refill  . atorvastatin (LIPITOR) 40 MG tablet Take 1 tablet (40 mg total) by mouth daily. 90 tablet 3  . cyclobenzaprine (FLEXERIL) 10 MG tablet Take 1 tablet (10 mg total) by mouth 3 (three) times daily as needed for muscle spasms. 30 tablet 0  . diclofenac (VOLTAREN) 75 MG EC tablet TAKE 1 TABLET(75 MG) BY MOUTH TWICE DAILY 60 tablet 1  . etonogestrel (NEXPLANON) 68 MG IMPL implant 1 each by Subdermal route once.    . hydrochlorothiazide (HYDRODIURIL) 25 MG tablet Take 1 tablet (25 mg total) by mouth daily. 90 tablet 3  . metroNIDAZOLE (FLAGYL) 500 MG tablet Take 1 tablet (500 mg total) by mouth 2 (two) times daily. 14 tablet 0  . Multiple Vitamin (MULTIVITAMIN) capsule Take 1 capsule by mouth daily.     No current facility-administered  medications on file prior to visit.    Observations/Objective: Back pain   Assessment and Plan: Diagnoses and all orders for this visit:  Chronic left-sided low back pain, unspecified whether sciatica present -     meloxicam (MOBIC) 7.5 MG tablet; Take 1 tablet (7.5 mg total) by mouth daily. -     AMB referral to orthopedics  Essential hypertension Counseled on blood pressure goal of less than 130/80, low-sodium, DASH diet, medication compliance, 150 minutes of moderate intensity exercise per week. Discussed medication compliance, adverse effects.   Follow Up Instructions:    I discussed the assessment and treatment plan with the patient. The patient was provided an opportunity to ask questions and all were answered. The patient agreed with the plan and demonstrated an understanding of the instructions.   The patient was advised to call back or seek an in-person evaluation if the symptoms worsen or if the condition fails to improve as anticipated.     I provided 15 minutes total of non-face-to-face time during this encounter including median intraservice time, reviewing previous notes, investigations, ordering medications, medical decision making, coordinating care and patient verbalized understanding at the end of the visit. MRI daily.,  This note has been created with Surveyor, quantity. Any transcriptional errors are unintentional.   Kerin Perna, NP 11/30/2020, 4:56 PM

## 2020-12-07 ENCOUNTER — Other Ambulatory Visit (INDEPENDENT_AMBULATORY_CARE_PROVIDER_SITE_OTHER): Payer: Self-pay | Admitting: Primary Care

## 2020-12-07 DIAGNOSIS — M79605 Pain in left leg: Secondary | ICD-10-CM

## 2020-12-07 DIAGNOSIS — G8929 Other chronic pain: Secondary | ICD-10-CM

## 2020-12-09 ENCOUNTER — Ambulatory Visit (INDEPENDENT_AMBULATORY_CARE_PROVIDER_SITE_OTHER): Payer: BC Managed Care – PPO | Admitting: Family Medicine

## 2020-12-09 ENCOUNTER — Encounter: Payer: Self-pay | Admitting: Family Medicine

## 2020-12-09 ENCOUNTER — Other Ambulatory Visit: Payer: Self-pay

## 2020-12-09 DIAGNOSIS — M79605 Pain in left leg: Secondary | ICD-10-CM | POA: Diagnosis not present

## 2020-12-09 NOTE — Progress Notes (Signed)
   Office Visit Note   Patient: Ashley Li           Date of Birth: Jun 10, 1981           MRN: [id number] Visit Date: 12/09/2020 Requested by: Kerin Perna, NP [address] [place],  [place] [address] PCP: Kerin Perna, NP  Subjective: Chief Complaint  Patient presents with  . Left Leg - Pain    Intermittent pain in the left leg x a few years. This is a burning and freezing sensation that starts in the lateral hip and down to the ankle. Hurts with walking and when she tries to sleep. Today, she is not having the pain. Foot gets tingly, but not numb. Pain is worse at night when she tries to rest.    HPI: She is here with left leg pain.  She has had pain for a few years at least.  She recalls about 8 years ago when she was out with her sister 1 night, she fell and injured herself but kept ongoing and did not seek treatment.  For a few months she was having pain in the left leg and eventually the pain went away.  Then a few years ago the pain came back, similar to what it was before only this time it does not seem to be getting any better.  She has good days and bad days from a pain standpoint, and a constant tingling sensation down her leg to the bottom of the foot.  Denies any weakness, denies any bowel or bladder dysfunction.  She has tried different medications which have not made any difference.  Today she is feeling better than usual.  Pain is worst when sitting or lying down, better when standing and moving around.                ROS:   All other systems were reviewed and are negative.  Objective: Vital Signs: There were no vitals taken for this visit.  Physical Exam:  General:  Alert and oriented, in no acute distress. Pulm:  Breathing unlabored. Psy:  Normal mood, congruent affect.  Low back: No tenderness in the midline lumbosacral spine.  Slightly tender in the left sciatic notch.  Piriformis stretch is equivocal.  No pain with internal hip rotation.   Negative straight leg raise, lower extremity strength and reflexes are normal.    Imaging: No results found.  Assessment & Plan: 1.  Chronic left leg pain, possibilities include piriformis syndrome versus lumbar disc protrusion. -We will try physical therapy.  If she fails to improve, then x-rays lumbar spine and left hip, as well as MRI of the lumbar spine.     Procedures: No procedures performed        PMFS History: Patient Active Problem List   Diagnosis Date Noted  . Screening for cervical cancer 08/20/2017   History reviewed. No pertinent past medical history.  History reviewed. No pertinent family history.  History reviewed. No pertinent surgical history. Social History   Occupational History  . Not on file  Tobacco Use  . Smoking status: Never Smoker  . Smokeless tobacco: Never Used  Substance and Sexual Activity  . Alcohol use: Yes    Comment: socially   . Drug use: No  . Sexual activity: Yes    Birth control/protection: Inserts

## 2020-12-26 ENCOUNTER — Ambulatory Visit (INDEPENDENT_AMBULATORY_CARE_PROVIDER_SITE_OTHER): Payer: BC Managed Care – PPO | Admitting: Primary Care

## 2021-01-06 ENCOUNTER — Ambulatory Visit: Payer: BC Managed Care – PPO | Attending: Family Medicine | Admitting: Physical Therapy

## 2021-01-06 ENCOUNTER — Other Ambulatory Visit: Payer: Self-pay

## 2021-01-06 DIAGNOSIS — M79604 Pain in right leg: Secondary | ICD-10-CM | POA: Diagnosis not present

## 2021-01-06 DIAGNOSIS — M6281 Muscle weakness (generalized): Secondary | ICD-10-CM

## 2021-01-06 DIAGNOSIS — R293 Abnormal posture: Secondary | ICD-10-CM | POA: Diagnosis not present

## 2021-01-06 DIAGNOSIS — M79605 Pain in left leg: Secondary | ICD-10-CM | POA: Diagnosis not present

## 2021-01-06 NOTE — Therapy (Signed)
[place], [place], [address] Phone: [phone]   Fax:  [phone]  Physical Therapy Evaluation  Patient Details  Name: Ashley Li MRN: [id number] Date of Birth: [birth year] Referring Provider (PT): Dr. Eunice Blase   Encounter Date: 01/06/2021   PT End of Session - 01/06/21 1314     Visit Number 1    Number of Visits 9    Date for PT Re-Evaluation 02/10/21    Authorization Type BCBS and [place] Medicaid    PT Start Time 1216    PT Stop Time 1307    PT Time Calculation (min) 51 min    Activity Tolerance Patient tolerated treatment well    Behavior During Therapy [facility] for tasks assessed/performed             No past medical history on file.  No past surgical history on file.  There were no vitals filed for this visit.    Subjective Assessment - 01/06/21 1218     Subjective Pt with intermittent pain in L leg . It began with a fall in 2014 and at that time she did have back and L hip pain. It gradually resolved but   It has become more consistent, 2 mos constantly. She is better when she moves.  She has difficulty in AM (pain), sitting worse than standing.  She has no back pain just in the L anterior aspect of thigh and lower leg and stops at the ankle. She denies weakness in LLE but does wear a brace because the support feels better. Today the Rt one is hurting her, not the L .    Pertinent History none relevant    Limitations Sitting;Standing;Other (comment)   sleep   Diagnostic tests XR 2014 lumbar and hip unremarkable    Patient Stated Goals pain relief, figure out what this is    Currently in Pain? Yes    Pain Score 4     Pain Location Hip    Pain Orientation Right;Proximal    Pain Descriptors / Indicators Aching;Burning   hot and cold   Pain Type Chronic pain    Pain Radiating Towards hip to ankle    Pain Onset In the past 7 days   the R one is new, the L hip has beenhurting > 2 yrs    Pain Frequency Constant    Aggravating Factors  sitting or standing still.  Sitting is worse than standing    Pain Relieving Factors moving around. min help from meds and heat    Effect of Pain on Daily Activities 2 mos ago, was limping .    Multiple Pain Sites No                [facility] PT Assessment - 01/06/21 0001       Assessment   Medical Diagnosis L leg pain    Referring Provider (PT) Dr. Legrand Como [place]    Onset Date/Surgical Date --   acute on chronic 2 mos   Hand Dominance Right    Next MD Visit 4 weeks    Prior Therapy No      Precautions   Precautions None      Restrictions   Weight Bearing Restrictions No      Balance Screen   Has the patient fallen in the past 6 months No      [place] residence    Living Arrangements Children  Type of [place] to enter    [place] One level      Prior Function   Level of Independence Independent    Vocation Full time employment    Vocation Requirements [employer]    Leisure 2 young kids, reading, yardwork      Cognition   Overall Cognitive Status Within Functional Limits for tasks assessed      Observation/Other Assessments   Focus on Therapeutic Outcomes (FOTO)  63%      Sensation   Light Touch Appears Intact      Posture/Postural Control   Posture/Postural Control Postural limitations    Postural Limitations Anterior pelvic tilt;Rounded Shoulders;Forward head      AROM   Lumbar Flexion WFL    Lumbar Extension WFL    Lumbar - Right Side Bend WFL    Lumbar - Left Side Bend WFL    Lumbar - Right Rotation WFL    Lumbar - Left Rotation WFL      PROM   Overall PROM Comments no pain with hip PROM      Strength   Right Hip Flexion 5/5    Right Hip Extension 3+/5    Right Hip ABduction 4-/5    Left Hip Flexion 5/5    Left Hip Extension 3+/5    Left Hip ABduction 4/5    Right/Left Knee --   5/5   Right/Left Ankle --   ankle DF 5/5      Flexibility   Hamstrings tight    Quadriceps tight and tight hip flexors    ITB tight R>L      Palpation   Palpation comment min pain with palpation to anterolateral Rt hip , iliac crest to lateral hamstring and ITB      Special Tests    Special Tests Lumbar;Hip Special Tests    Other special tests POE did not change sx    Lumbar Tests FABER test;Prone Knee Bend Test;Straight Leg Raise    Hip Special Tests  Saralyn Pilar (FABER) Test;Thomas Test;Trendelenberg Test      FABER test   findings Negative      Prone Knee Bend Test   Findings Negative    Comment no back pain with either leg      Straight Leg Raise   Side  Left    Comment neg bilat      Trendelenburg Test   Findings Negative    Comments bilateral      Thomas Test    Findings Positive    Comments bilateral with L femur more lateral indicating ITB tightness      Transfers   Five time sit to stand comments  not measured appeared WNL      Ambulation/Gait   Ambulation Distance (Feet) 150 Feet    Ambulation Surface Level;Indoor    Gait Comments no limp                        Objective measurements completed on examination: See above findings.       [facility] Adult PT Treatment/Exercise - 01/06/21 0001       Self-Care   Self-Care Other Self-Care Comments;Heat/Ice Application;Posture    Heat/Ice Application see education    Other Self-Care Comments  HEP      Knee/Hip Exercises: Stretches   Active Hamstring Stretch Right;Left;1 rep    Hip Flexor Stretch Left;Right;Both;1 rep    ITB Stretch Right;Left;1 rep  Knee/Hip Exercises: Supine   Bridges Both;1 set;10 reps    Bridges Limitations with post tilt    Bridges with Clamshell Both;1 set;10 reps                    PT Education - 01/06/21 1313     Education Details PT/POC, HEP, differential diagnosis, eval findings    Person(s) Educated Patient    Methods Explanation;Handout    Comprehension Verbalized understanding;Returned  demonstration                 PT Long Term Goals - 01/06/21 1315       PT LONG TERM GOAL #1   Title Pt will be I with HEP for stretching and strengthing hips and core    Baseline unknown, given on eval    Time 5    Period Weeks    Status New    Target Date 02/10/21      PT LONG TERM GOAL #2   Title Pt will be able to reduce pain in leg(s) to < 2/10 most days of the week for improved sleep, rest    Baseline Can be 5/10 or more and makes sleep difficult    Time 5    Period Weeks    Status New    Target Date 02/10/21      PT LONG TERM GOAL #3   Title Patient will incrase strength in hips to 5/5 in hip extension and hip abduction bilaterally    Baseline 3+/5 to 4/5    Time 5    Period Weeks    Status New    Target Date 02/10/21      PT LONG TERM GOAL #4   Title Pt will be able attend social events without increasing leg pain for up to 3 hours.    Baseline unable to wear certain shoes, stand still for long periods    Time 5    Period Weeks    Status New    Target Date 02/10/21      PT LONG TERM GOAL #5   Title FOTO score will improve to 89% to show improved functional mobility    Baseline 63%    Time 5    Period Weeks    Status New    Target Date 02/10/21                    Plan - 01/06/21 1325     Clinical Impression Statement Patient presents for mod complexity eval of L and now Rt sided hip and leg pain which has changed recently.  She had no pain in L side today, Rt leg was 4/10 with similar presentation.  She has tightness throughout hip musculature, weakness in lower abdominals and gluteals.  Exam ruled out piriformis but she may have a lumbar involvement vs meralgia paresthetica due to the quality of her pain (sensory in nature).  She has normal strength in knees, ankles and spine ROM did not change sx.  Will work to get her in for 4-5 weeks of PT and if no change will send back to D for further workup.    Personal Factors and Comorbidities Time  since onset of injury/illness/exacerbation    Examination-Activity Limitations Stand;Sit;Sleep    Examination-Participation Restrictions Occupation;Interpersonal Relationship    Stability/Clinical Decision Making Evolving/Moderate complexity    Clinical Decision Making Moderate    Rehab Potential Excellent    PT Frequency 2x / week    PT  Duration 4 weeks   5 weeks total   PT Treatment/Interventions ADLs/Self Care Home Management;Cryotherapy;Moist Heat;Manual techniques;Dry needling;Passive range of motion;Therapeutic exercise;Functional mobility training;Therapeutic activities    PT Next Visit Plan check HEP, manual /release hip flexors, quads and ITB    PT Home Exercise Plan Access Code: [email or web address]    [email or web address] and Agree with Plan of Care Patient             Patient will benefit from skilled therapeutic intervention in order to improve the following deficits and impairments:  Pain, Postural dysfunction, Increased fascial restricitons, Decreased strength, Decreased range of motion  Visit Diagnosis: Pain in left leg  Pain in right leg  Abnormal posture  Muscle weakness (generalized)     Problem List Patient Active Problem List   Diagnosis Date Noted   Screening for cervical cancer 08/20/2017    Lealer [place] 01/06/2021, 1:34 PM  [place], [place], [address] Phone: [phone]   Fax:  [phone]  Name: Ashley Li MRN: [id number] Date of Birth: [birth year]  Raeford Razor, PT 01/06/21 1:34 PM Phone: [phone] Fax: [phone]

## 2021-01-06 NOTE — Patient Instructions (Signed)
Access Code: [email or web address] URL: [email or web address] Date: 01/06/2021 Prepared by: Raeford Razor  Exercises Supine ITB Stretch with Strap - 1 x daily - 7 x weekly - 1 sets - 3 reps - 30 hold Supine Hamstring Stretch with Strap - 1 x daily - 7 x weekly - 1 sets - 3 reps - 30 hold Thomas Stretch on Table - 1 x daily - 7 x weekly - 1 sets - 3 reps - 30 hold Supine Transversus Abdominis Bracing - Hands on Stomach - 1 x daily - 7 x weekly - 2 sets - 10 reps - 5 hold Pilates Bridge - 1 x daily - 7 x weekly - 2 sets - 10 reps - 5 hold Bridge with Hip Abduction and Resistance - 1 x daily - 7 x weekly - 2 sets - 10 reps - 5 hold

## 2021-01-13 ENCOUNTER — Ambulatory Visit (INDEPENDENT_AMBULATORY_CARE_PROVIDER_SITE_OTHER): Payer: BC Managed Care – PPO | Admitting: Primary Care

## 2021-01-13 ENCOUNTER — Ambulatory Visit: Payer: BC Managed Care – PPO | Attending: Family Medicine

## 2021-01-13 ENCOUNTER — Other Ambulatory Visit: Payer: Self-pay

## 2021-01-13 ENCOUNTER — Encounter (INDEPENDENT_AMBULATORY_CARE_PROVIDER_SITE_OTHER): Payer: Self-pay

## 2021-01-13 DIAGNOSIS — M79605 Pain in left leg: Secondary | ICD-10-CM | POA: Insufficient documentation

## 2021-01-13 DIAGNOSIS — R293 Abnormal posture: Secondary | ICD-10-CM | POA: Insufficient documentation

## 2021-01-13 DIAGNOSIS — M6281 Muscle weakness (generalized): Secondary | ICD-10-CM | POA: Insufficient documentation

## 2021-01-13 DIAGNOSIS — M79604 Pain in right leg: Secondary | ICD-10-CM | POA: Diagnosis not present

## 2021-01-13 NOTE — Therapy (Signed)
[place], [place], [address] Phone: [phone]   Fax:  [phone]  Physical Therapy Treatment  Patient Details  Name: Ashley Li MRN: [id number] Date of Birth: May 23, 1981 Referring Provider (PT): Dr. Eunice Blase   Encounter Date: 01/13/2021   PT End of Session - 01/13/21 1443     Visit Number 2    Number of Visits 9    Date for PT Re-Evaluation 02/10/21    Authorization Type BCBS and [place] Medicaid    PT Start Time 1400    PT Stop Time 1445    PT Time Calculation (min) 45 min    Activity Tolerance Patient tolerated treatment well    Behavior During Therapy [facility] for tasks assessed/performed             History reviewed. No pertinent past medical history.  History reviewed. No pertinent surgical history.  There were no vitals filed for this visit.   Subjective Assessment - 01/13/21 1403     Subjective Pt reports feeling great since starting PT. She reports having no pain the past 3-4 days and was even able to walk a lot at a conference this past weekend without any difficulty. Pt reports being adherent to her HEP.    Currently in Pain? No/denies    Pain Score 0-No pain                               [facility] Adult PT Treatment/Exercise - 01/13/21 0001       Knee/Hip Exercises: Machines for Strengthening   Other Machine Tall kneeling hip thrust 30# 3x10      Knee/Hip Exercises: Standing   Other Standing Knee Exercises Pallof press at cable column 10# 3x10 BIL      Knee/Hip Exercises: Sidelying   Other Sidelying Knee/Hip Exercises Side knee plank with clams 2x10                    PT Education - 01/13/21 1442     Education Details Pt educated on the importance of regular adherence to her HEP. Also instructed on proper form when perofmring newly added exercises.    Person(s) Educated Patient    Methods Explanation;Handout;Demonstration    Comprehension  Verbalized understanding;Returned demonstration                 PT Long Term Goals - 01/06/21 1315       PT LONG TERM GOAL #1   Title Pt will be I with HEP for stretching and strengthing hips and core    Baseline unknown, given on eval    Time 5    Period Weeks    Status New    Target Date 02/10/21      PT LONG TERM GOAL #2   Title Pt will be able to reduce pain in leg(s) to < 2/10 most days of the week for improved sleep, rest    Baseline Can be 5/10 or more and makes sleep difficult    Time 5    Period Weeks    Status New    Target Date 02/10/21      PT LONG TERM GOAL #3   Title Patient will incrase strength in hips to 5/5 in hip extension and hip abduction bilaterally    Baseline 3+/5 to 4/5    Time 5    Period Weeks    Status New  Target Date 02/10/21      PT LONG TERM GOAL #4   Title Pt will be able attend social events without increasing leg pain for up to 3 hours.    Baseline unable to wear certain shoes, stand still for long periods    Time 5    Period Weeks    Status New    Target Date 02/10/21      PT LONG TERM GOAL #5   Title FOTO score will improve to 89% to show improved functional mobility    Baseline 63%    Time 5    Period Weeks    Status New    Target Date 02/10/21                   Plan - 01/13/21 1443     Clinical Impression Statement Pt presents with 0/10 pain today and reports much benefit from PT so far. She tolerated all treatment well today with no increase in pain and with proper form when performing new exercises. She will continue to benefit from skilled PT to address her primary impairments and to return to her prior level of function without limitation.    Personal Factors and Comorbidities Time since onset of injury/illness/exacerbation    Examination-Activity Limitations Stand;Sit;Sleep    Examination-Participation Restrictions Occupation;Interpersonal Relationship    Stability/Clinical Decision Making  Evolving/Moderate complexity    Clinical Decision Making Moderate    Rehab Potential Excellent    PT Frequency 2x / week    PT Duration 4 weeks    PT Treatment/Interventions ADLs/Self Care Home Management;Cryotherapy;Moist Heat;Manual techniques;Dry needling;Passive range of motion;Therapeutic exercise;Functional mobility training;Therapeutic activities    PT Next Visit Plan check HEP, manual /release hip flexors, quads and ITB, progress hip/ core strengthening    PT Home Exercise Plan Access Code: [email or web address]    [email or web address] and Agree with Plan of Care Patient             Patient will benefit from skilled therapeutic intervention in order to improve the following deficits and impairments:  Pain, Postural dysfunction, Increased fascial restricitons, Decreased strength, Decreased range of motion  Visit Diagnosis: Pain in left leg  Pain in right leg  Abnormal posture  Muscle weakness (generalized)     Problem List Patient Active Problem List   Diagnosis Date Noted   Screening for cervical cancer 08/20/2017    Vanessa [place], PT, DPT 01/13/21 2:47 PM   [place] [facility] [address] [place], [place], [address] Phone: [phone]   Fax:  [phone]  Name: Ashley Li MRN: [id number] Date of Birth: 06/11/1981

## 2021-01-13 NOTE — Patient Instructions (Signed)
  [place]

## 2021-01-20 ENCOUNTER — Ambulatory Visit: Payer: BC Managed Care – PPO

## 2021-01-20 ENCOUNTER — Other Ambulatory Visit: Payer: Self-pay

## 2021-01-20 DIAGNOSIS — R293 Abnormal posture: Secondary | ICD-10-CM

## 2021-01-20 DIAGNOSIS — M6281 Muscle weakness (generalized): Secondary | ICD-10-CM

## 2021-01-20 DIAGNOSIS — M79604 Pain in right leg: Secondary | ICD-10-CM | POA: Diagnosis not present

## 2021-01-20 DIAGNOSIS — M79605 Pain in left leg: Secondary | ICD-10-CM

## 2021-01-20 NOTE — Therapy (Signed)
[place], [place], [address] Phone: [phone]   Fax:  [phone]  Physical Therapy Treatment  Patient Details  Name: Ashley Li MRN: [id number] Date of Birth: Jan 27, 1981 Referring Provider (PT): Dr. Eunice Blase   Encounter Date: 01/20/2021   PT End of Session - 01/20/21 1246     Visit Number 3    Number of Visits 9    Date for PT Re-Evaluation 02/10/21    Authorization Type BCBS and [place] Medicaid    PT Start Time 1215    PT Stop Time 1300    PT Time Calculation (min) 45 min    Activity Tolerance Patient tolerated treatment well    Behavior During Therapy [facility] for tasks assessed/performed             History reviewed. No pertinent past medical history.  History reviewed. No pertinent surgical history.  There were no vitals filed for this visit.   Subjective Assessment - 01/20/21 1211     Subjective Pt reports feeling much better the past couple of weeks, reporting no pain in her concordant L hip or low back. However, she reports "sleeping wrong" a few days ago, resulting in R hip pain the past few days. She reports being regularly adherent to her HEP.    Currently in Pain? Yes    Pain Score 7     Pain Location Hip    Pain Orientation Right;Proximal    Pain Descriptors / Indicators Aching;Dull                [facility] PT Assessment - 01/20/21 0001       Palpation   Palpation comment TTP to R Proximal IT band and TFL      Special Tests    Special Tests Hip Special Tests    Hip Special Tests  Ober's Test;Hip Scouring      Ober's Test   Findings Positive    Side Right      Hip Scouring   Findings Negative    Side Right;Left                           [facility] Adult PT Treatment/Exercise - 01/20/21 0001       Knee/Hip Exercises: Stretches   Other Knee/Hip Stretches Standing overhead IT band stretch 2x1 minute on R      Knee/Hip Exercises: Standing    Other Standing Knee Exercises Single leg Pallof press hip IR/ER 2x8 each BIL 3# cable      Modalities   Modalities Moist Heat      Moist Heat Therapy   Number Minutes Moist Heat 10 Minutes    Moist Heat Location Hip   R     Manual Therapy   Manual Therapy Soft tissue mobilization    Soft tissue mobilization Cross friction and rolling to R IT band                    PT Education - 01/20/21 1245     Education Details Pt educated on potential underlying physiology behind her symptoms, namely IT band syndrome. Also educated on proper form when performing new exercises.    Person(s) Educated Patient    Methods Explanation;Handout;Demonstration    Comprehension Verbalized understanding;Returned demonstration                 PT Long Term Goals - 01/06/21 1315  PT LONG TERM GOAL #1   Title Pt will be I with HEP for stretching and strengthing hips and core    Baseline unknown, given on eval    Time 5    Period Weeks    Status New    Target Date 02/10/21      PT LONG TERM GOAL #2   Title Pt will be able to reduce pain in leg(s) to < 2/10 most days of the week for improved sleep, rest    Baseline Can be 5/10 or more and makes sleep difficult    Time 5    Period Weeks    Status New    Target Date 02/10/21      PT LONG TERM GOAL #3   Title Patient will incrase strength in hips to 5/5 in hip extension and hip abduction bilaterally    Baseline 3+/5 to 4/5    Time 5    Period Weeks    Status New    Target Date 02/10/21      PT LONG TERM GOAL #4   Title Pt will be able attend social events without increasing leg pain for up to 3 hours.    Baseline unable to wear certain shoes, stand still for long periods    Time 5    Period Weeks    Status New    Target Date 02/10/21      PT LONG TERM GOAL #5   Title FOTO score will improve to 89% to show improved functional mobility    Baseline 63%    Time 5    Period Weeks    Status New    Target Date 02/10/21                    Plan - 01/20/21 1252     Clinical Impression Statement Pt presents with increased R hip pain over the past few days. Upon re-assessment, the pt was TTP along her proximal IT band and also demonstrated a positive Ober's test on the R. 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 Treatment today focused on addressing her tight IT band, as well as progressed hip strengthening. She had a positive response to manual therapy and heat, stating she feels much better leaving clinic with pain at 2/10. She will continue to benefit from skilled PT to address her primary impairments and return to her prior level of function without limitation.    Personal Factors and Comorbidities Time since onset of injury/illness/exacerbation    Examination-Activity Limitations Stand;Sit;Sleep    Examination-Participation Restrictions Occupation;Interpersonal Relationship    Stability/Clinical Decision Making Evolving/Moderate complexity    Clinical Decision Making Moderate    Rehab Potential Excellent    PT Frequency 2x / week    PT Duration 4 weeks    PT Treatment/Interventions ADLs/Self Care Home Management;Cryotherapy;Moist Heat;Manual techniques;Dry needling;Passive range of motion;Therapeutic exercise;Functional mobility training;Therapeutic activities    PT Next Visit Plan Address tight hip musculature with emphasis on R IT band. Continue to progress hip strengthening.    PT Home Exercise Plan Access Code: [email or web address]    AJOINOMVE and Agree with Plan of Care Patient             Patient will benefit from skilled therapeutic intervention in order to improve the following deficits and impairments:  Pain, Postural dysfunction, Increased fascial restricitons, Decreased strength, Decreased range of motion  Visit Diagnosis: Pain in left leg  Pain in right leg  Abnormal posture  Muscle weakness (generalized)     Problem List Patient Active Problem List   Diagnosis Date Noted    Screening for cervical cancer 08/20/2017    Vanessa , PT, DPT 01/20/21 1:15 PM   [place] [facility] [address] [place], [place], [address] Phone: [phone]   Fax:  [phone]  Name: Ashley Li MRN: [id number] Date of Birth: 26-Oct-1980

## 2021-01-20 NOTE — Patient Instructions (Signed)
  Ashley Li

## 2021-01-21 ENCOUNTER — Ambulatory Visit: Payer: BC Managed Care – PPO | Admitting: Rehabilitative and Restorative Service Providers"

## 2021-01-21 ENCOUNTER — Telehealth: Payer: Self-pay | Admitting: Rehabilitative and Restorative Service Providers"

## 2021-01-21 NOTE — Telephone Encounter (Signed)
PT phoned pt; she immediately answered and stated her apologies and that she overslept. PT reminded her of her next appt.

## 2021-01-27 ENCOUNTER — Encounter: Payer: Self-pay | Admitting: Physical Therapy

## 2021-01-27 ENCOUNTER — Other Ambulatory Visit: Payer: Self-pay

## 2021-01-27 ENCOUNTER — Ambulatory Visit: Payer: BC Managed Care – PPO | Admitting: Physical Therapy

## 2021-01-27 DIAGNOSIS — R293 Abnormal posture: Secondary | ICD-10-CM | POA: Diagnosis not present

## 2021-01-27 DIAGNOSIS — M6281 Muscle weakness (generalized): Secondary | ICD-10-CM | POA: Diagnosis not present

## 2021-01-27 DIAGNOSIS — M79605 Pain in left leg: Secondary | ICD-10-CM

## 2021-01-27 DIAGNOSIS — M79604 Pain in right leg: Secondary | ICD-10-CM | POA: Diagnosis not present

## 2021-01-27 NOTE — Therapy (Signed)
[place], [place], [address] Phone: [phone]   Fax:  [phone]  Physical Therapy Treatment  Patient Details  Name: Ashley Li MRN: [id number] Date of Birth: 08-Oct-1980 Referring Provider (PT): Dr. Eunice Blase   Encounter Date: 01/27/2021   PT End of Session - 01/27/21 1215     Visit Number 4    Number of Visits 9    Date for PT Re-Evaluation 02/10/21    Authorization Type BCBS and [place] Medicaid    PT Start Time 1100    PT Stop Time 1156    PT Time Calculation (min) 56 min    Activity Tolerance Patient tolerated treatment well    Behavior During Therapy [facility] for tasks assessed/performed             History reviewed. No pertinent past medical history.  History reviewed. No pertinent surgical history.  There were no vitals filed for this visit.   Subjective Assessment - 01/27/21 1115     Subjective Overall it is much better.  4/10 in Rt hip.  Does take standing breaks to stretch.    Currently in Pain? Yes    Pain Score 4     Pain Location Hip    Pain Orientation Right;Proximal    Pain Descriptors / Indicators Aching;Tingling    Pain Type Chronic pain    Pain Onset 1 to 4 weeks ago    Pain Frequency Intermittent    Aggravating Factors  sitting    Pain Relieving Factors stretching              [facility] Adult PT Treatment/Exercise - 01/27/21 0001       Knee/Hip Exercises: Stretches   Hip Flexor Stretch Limitations done in standing, tall kneeling and supine x 30 sec x 6    ITB Stretch Limitations knees crossed in supine with LTR and standing    Other Knee/Hip Stretches 3 way hip supine with strap   each LE x 30 sec     Knee/Hip Exercises: Aerobic   Stationary Bike 5 min L2      Knee/Hip Exercises: Supine   Bridges Both;1 set;10 reps    Bridges Limitations with post tilt      Moist Heat Therapy   Number Minutes Moist Heat 10 Minutes    Moist Heat Location Hip   R      Manual Therapy   Manual Therapy Soft tissue mobilization    Manual therapy comments supine Rt LE off mat, and then sidelying for Rt post lateral hip    Soft tissue mobilization TFL, quad, ITB with IASTM tool                         PT Long Term Goals - 01/06/21 1315       PT LONG TERM GOAL #1   Title Pt will be I with HEP for stretching and strengthing hips and core    Baseline unknown, given on eval    Time 5    Period Weeks    Status New    Target Date 02/10/21      PT LONG TERM GOAL #2   Title Pt will be able to reduce pain in leg(s) to < 2/10 most days of the week for improved sleep, rest    Baseline Can be 5/10 or more and makes sleep difficult    Time 5    Period Weeks  Status New    Target Date 02/10/21      PT LONG TERM GOAL #3   Title Patient will incrase strength in hips to 5/5 in hip extension and hip abduction bilaterally    Baseline 3+/5 to 4/5    Time 5    Period Weeks    Status New    Target Date 02/10/21      PT LONG TERM GOAL #4   Title Pt will be able attend social events without increasing leg pain for up to 3 hours.    Baseline unable to wear certain shoes, stand still for long periods    Time 5    Period Weeks    Status New    Target Date 02/10/21      PT LONG TERM GOAL #5   Title FOTO score will improve to 89% to show improved functional mobility    Baseline 63%    Time 5    Period Weeks    Status New    Target Date 02/10/21                   Plan - 01/27/21 1129     Clinical Impression Statement Patient continues to feel benefit from stretching and more frequent standing, positon changes while working.  Addressed soft tissue with manual therapy using hands and IASTM for Rt hip.  No pain as she left the clinic.  Needs to work on release of anterior hip, increase lower ab strength and posture to counteract effects of sitting.    PT Treatment/Interventions ADLs/Self Care Home Management;Cryotherapy;Moist Heat;Manual  techniques;Dry needling;Passive range of motion;Therapeutic exercise;Functional mobility training;Therapeutic activities    PT Next Visit Plan Address tight hip musculature with emphasis on R IT band. Continue to progress hip strengthening add core. check goals    PT Home Exercise Plan Access Code: [email or web address]             Patient will benefit from skilled therapeutic intervention in order to improve the following deficits and impairments:  Pain, Postural dysfunction, Increased fascial restricitons, Decreased strength, Decreased range of motion  Visit Diagnosis: Pain in left leg  Abnormal posture  Pain in right leg  Muscle weakness (generalized)     Problem List Patient Active Problem List   Diagnosis Date Noted   Screening for cervical cancer 08/20/2017    Atharv Barriere 01/27/2021, 12:46 PM  [facility] [address] [place], [place], [address] Phone: [phone]   Fax:  [phone]  Name: Ashley Li MRN: [id number] Date of Birth: April 11, 1981   Raeford Razor, PT 01/27/21 12:46 PM Phone: [phone] Fax: [phone]

## 2021-01-28 ENCOUNTER — Ambulatory Visit: Payer: BC Managed Care – PPO

## 2021-01-28 DIAGNOSIS — M79605 Pain in left leg: Secondary | ICD-10-CM | POA: Diagnosis not present

## 2021-01-28 DIAGNOSIS — M79604 Pain in right leg: Secondary | ICD-10-CM

## 2021-01-28 DIAGNOSIS — R293 Abnormal posture: Secondary | ICD-10-CM | POA: Diagnosis not present

## 2021-01-28 DIAGNOSIS — M6281 Muscle weakness (generalized): Secondary | ICD-10-CM | POA: Diagnosis not present

## 2021-01-28 NOTE — Therapy (Signed)
[place], [place], [address] Phone: [phone]   Fax:  [phone]  Physical Therapy Treatment  Patient Details  Name: Ashley Li MRN: [id number] Date of Birth: [birth year] Referring Provider (PT): Dr. Eunice Blase   Encounter Date: 01/28/2021   PT End of Session - 01/28/21 0906     Visit Number 5    Number of Visits 9    Authorization Type BCBS and [place] Medicaid    PT Start Time 0902    PT Stop Time 0959    PT Time Calculation (min) 57 min    Activity Tolerance Patient tolerated treatment well    Behavior During Therapy [facility] for tasks assessed/performed             History reviewed. No pertinent past medical history.  History reviewed. No pertinent surgical history.  There were no vitals filed for this visit.   Subjective Assessment - 01/28/21 0904     Subjective Pain in the R leg is no where the severity of what it was.    Patient Stated Goals pain relief, figure out what this is    Currently in Pain? Yes    Pain Score 4     Pain Location Hip    Pain Orientation Right;Proximal    Pain Descriptors / Indicators Aching;Tingling    Pain Type Chronic pain    Pain Onset 1 to 4 weeks ago    Pain Frequency Intermittent                               [facility] Adult PT Treatment/Exercise - 01/28/21 0001       Exercises   Exercises Knee/Hip;Lumbar      Lumbar Exercises: Supine   Pelvic Tilt 10 reps      Knee/Hip Exercises: Stretches   Hip Flexor Stretch Limitations done in standing, tall kneeling and supine x 30 sec x 6    ITB Stretch Limitations knees crossed in supine with LTR and standing, 30 sec x2    Other Knee/Hip Stretches Standing overhead IT band stretch 2x1 minute on R    Other Knee/Hip Stretches 3 way hip supine with strap   each LE x 30 sec     Knee/Hip Exercises: Aerobic   Stationary Bike 5 min L2      Knee/Hip Exercises: Supine   Bridges Both;1  set;10 reps    Bridges Limitations with PPT      Moist Heat Therapy   Number Minutes Moist Heat 15 Minutes    Moist Heat Location Hip   R     Manual Therapy   Manual Therapy Soft tissue mobilization    Soft tissue mobilization TFL, ITB, ERs with IASTM (roller) and manual                    PT Education - 01/28/21 1002     Education Details Ed. for use of pillow between kness sleeping for pelvis stability and to try and sleep on the L side more than the R.    Person(s) Educated Patient    Methods Explanation;Demonstration    Comprehension Verbalized understanding;Returned demonstration                 PT Long Term Goals - 01/06/21 1315       PT LONG TERM GOAL #1   Title Pt will be I with HEP for stretching and  strengthing hips and core    Baseline unknown, given on eval    Time 5    Period Weeks    Status New    Target Date 02/10/21      PT LONG TERM GOAL #2   Title Pt will be able to reduce pain in leg(s) to < 2/10 most days of the week for improved sleep, rest    Baseline Can be 5/10 or more and makes sleep difficult    Time 5    Period Weeks    Status New    Target Date 02/10/21      PT LONG TERM GOAL #3   Title Patient will incrase strength in hips to 5/5 in hip extension and hip abduction bilaterally    Baseline 3+/5 to 4/5    Time 5    Period Weeks    Status New    Target Date 02/10/21      PT LONG TERM GOAL #4   Title Pt will be able attend social events without increasing leg pain for up to 3 hours.    Baseline unable to wear certain shoes, stand still for long periods    Time 5    Period Weeks    Status New    Target Date 02/10/21      PT LONG TERM GOAL #5   Title FOTO score will improve to 89% to show improved functional mobility    Baseline 63%    Time 5    Period Weeks    Status New    Target Date 02/10/21                   Plan - 01/28/21 0907     Clinical Impression Statement PT was completed for flexibilty  for the R hip and and some strengthening to the R hip and core. Education was provided for use of pillow between knees when sleeping for pelvis stability and for her to try and sleep on heer L side more than the R. STM was completed to the peri-hip musculature (TFL, IT band and hip ERs). Pt tolerated the session well. Overall, since the initiation of PT, pt reports good improvement in R hip pain. Following today's session, pt reported a reduction of her R hip pain to 2/10 from 4/10.    Personal Factors and Comorbidities Time since onset of injury/illness/exacerbation    Examination-Activity Limitations Stand;Sit;Sleep    Examination-Participation Restrictions Occupation;Interpersonal Relationship    Stability/Clinical Decision Making Evolving/Moderate complexity    Clinical Decision Making Moderate    Rehab Potential Excellent    PT Frequency 2x / week    PT Duration 4 weeks    PT Treatment/Interventions ADLs/Self Care Home Management;Cryotherapy;Moist Heat;Manual techniques;Dry needling;Passive range of motion;Therapeutic exercise;Functional mobility training;Therapeutic activities    PT Home Exercise Plan Access Code: [email or web address]    [email or web address] and Agree with Plan of Care Patient             Patient will benefit from skilled therapeutic intervention in order to improve the following deficits and impairments:  Pain, Postural dysfunction, Increased fascial restricitons, Decreased strength, Decreased range of motion  Visit Diagnosis: Pain in left leg  Abnormal posture  Pain in right leg  Muscle weakness (generalized)     Problem List Patient Active Problem List   Diagnosis Date Noted   Screening for cervical cancer 08/20/2017   Gar Ponto MS, PT 01/28/21 10:14 AM   [place]  [place], [place], [address] Phone: [phone]   Fax:  [phone]  Name: KELI BUEHNER MRN: [id number] Date of Birth:  02-13-1981

## 2021-02-03 ENCOUNTER — Other Ambulatory Visit: Payer: Self-pay

## 2021-02-03 ENCOUNTER — Encounter: Payer: Self-pay | Admitting: Physical Therapy

## 2021-02-03 ENCOUNTER — Ambulatory Visit: Payer: BC Managed Care – PPO | Admitting: Physical Therapy

## 2021-02-03 DIAGNOSIS — R293 Abnormal posture: Secondary | ICD-10-CM | POA: Diagnosis not present

## 2021-02-03 DIAGNOSIS — M6281 Muscle weakness (generalized): Secondary | ICD-10-CM | POA: Diagnosis not present

## 2021-02-03 DIAGNOSIS — M79604 Pain in right leg: Secondary | ICD-10-CM

## 2021-02-03 DIAGNOSIS — M79605 Pain in left leg: Secondary | ICD-10-CM | POA: Diagnosis not present

## 2021-02-03 NOTE — Therapy (Signed)
[place], [place], [address] Phone: [phone]   Fax:  [phone]  Physical Therapy Treatment  Patient Details  Name: Ashley Li MRN: [id number] Date of Birth: 1981/06/30 Referring Provider (PT): Dr. Eunice Blase   Encounter Date: 02/03/2021   PT End of Session - 02/03/21 1157     Visit Number 6    Date for PT Re-Evaluation 02/10/21    Authorization Type BCBS and [place] Medicaid    PT Start Time 1150    PT Stop Time 1231    PT Time Calculation (min) 41 min    Activity Tolerance Patient tolerated treatment well    Behavior During Therapy [facility] for tasks assessed/performed             History reviewed. No pertinent past medical history.  History reviewed. No pertinent surgical history.  There were no vitals filed for this visit.   Subjective Assessment - 02/03/21 1153     Subjective Not in pain right and now and it has been pretty good all week.  She is doing her stretches and they are helping.    Currently in Pain? No/denies                [facility] Adult PT Treatment/Exercise - 02/03/21 0001       Pilates   Pilates Reformer Scooter: 1 red ant hip stretching x 3 then added sidebending for psoas.  Then hip extension x 15 each side      Lumbar Exercises: Stretches   Lower Trunk Rotation Limitations knees wide x 10    Hip Flexor Stretch 3 reps;20 seconds   used Refomer   ITB Stretch 3 reps    ITB Stretch Limitations standing on doorway    Piriformis Stretch 2 reps;20 seconds    Piriformis Stretch Limitations holding footbar      Lumbar Exercises: Aerobic   Elliptical 5 min L5 ramp L1 resist      Lumbar Exercises: Standing   Other Standing Lumbar Exercises Palloff press 2 green band add rotation in tandem            Bridge x 10 with PPT              PT Long Term Goals - 02/03/21 1157       PT LONG TERM GOAL #1   Title Pt will be I with HEP for stretching and  strengthing hips and core    Baseline up to date    Status On-going      PT LONG TERM GOAL #2   Title Pt will be able to reduce pain in leg(s) to < 2/10 most days of the week for improved sleep, rest    Baseline good today!    Status On-going      PT LONG TERM GOAL #3   Title Patient will incrase strength in hips to 5/5 in hip extension and hip abduction bilaterally    Baseline Rt abd and ext 4+/5 , Lt abd 4+5, Lt hip ext 4-/5    Status On-going      PT LONG TERM GOAL #4   Title Pt will be able attend social events without increasing leg pain for up to 3 hours.    Status Achieved      PT LONG TERM GOAL #5   Title FOTO score will improve to 89% to show improved functional mobility    Status Unable to assess  Plan - 02/03/21 1205     Clinical Impression Statement Patient noting an improvement with therapy interventions, enjoying and appreciating her stretches.  She has shows bilateral hip extension weakness and tightness anterolaterally.  She may consider continuing at less frequency to ensure maintaining her level of function vs DC. No pain today at all.    PT Treatment/Interventions ADLs/Self Care Home Management;Cryotherapy;Moist Heat;Manual techniques;Dry needling;Passive range of motion;Therapeutic exercise;Functional mobility training;Therapeutic activities    PT Next Visit Plan Address tight hip musculature with emphasis on R IT band. Continue to progress hip strengthening add core. check goals    PT Home Exercise Plan Access Code: [email or web address] and Agree with Plan of Care Patient             Patient will benefit from skilled therapeutic intervention in order to improve the following deficits and impairments:  Pain, Postural dysfunction, Increased fascial restricitons, Decreased strength, Decreased range of motion  Visit Diagnosis: Pain in left leg  Abnormal posture  Pain in right leg  Muscle weakness (generalized)     Problem  List Patient Active Problem List   Diagnosis Date Noted   Screening for cervical cancer 08/20/2017    Abhiraj Dozal 02/03/2021, 12:38 PM  [facility] [address] [place], [place], [address] Phone: [phone]   Fax:  [phone]  Name: CHIE GREENLIEF MRN: [id number] Date of Birth: 1981/06/06   Raeford Razor, PT 02/03/21 12:38 PM Phone: [phone] Fax: [phone]

## 2021-02-04 ENCOUNTER — Other Ambulatory Visit: Payer: Self-pay

## 2021-02-04 ENCOUNTER — Ambulatory Visit: Payer: BC Managed Care – PPO

## 2021-02-04 DIAGNOSIS — M79605 Pain in left leg: Secondary | ICD-10-CM | POA: Diagnosis not present

## 2021-02-04 DIAGNOSIS — M6281 Muscle weakness (generalized): Secondary | ICD-10-CM | POA: Diagnosis not present

## 2021-02-04 DIAGNOSIS — R293 Abnormal posture: Secondary | ICD-10-CM | POA: Diagnosis not present

## 2021-02-04 DIAGNOSIS — M79604 Pain in right leg: Secondary | ICD-10-CM

## 2021-02-04 NOTE — Therapy (Addendum)
[place], [place], [address] Phone: [phone]   Fax:  [phone]  Physical Therapy Treatment/Discharge  Patient Details  Name: IMANE BURROUGH MRN: [id number] Date of Birth: 02-18-1981 Referring Provider (PT): Dr. Eunice Blase   Encounter Date: 02/04/2021   PT End of Session - 02/04/21 1018     Visit Number 7    Number of Visits 9    Date for PT Re-Evaluation 04/08/21    Authorization Type BCBS and [place] Medicaid    PT Start Time 0903    PT Stop Time 0946    PT Time Calculation (min) 43 min    Activity Tolerance Patient tolerated treatment well    Behavior During Therapy [facility] for tasks assessed/performed             History reviewed. No pertinent past medical history.  History reviewed. No pertinent surgical history.  There were no vitals filed for this visit.   Subjective Assessment - 02/04/21 1017     Subjective Pt continues to report no pain.    Patient Stated Goals pain relief, figure out what this is    Currently in Pain? No/denies    Pain Location Hip    Pain Orientation Right;Left    Pain Onset 1 to 4 weeks ago                [facility] PT Assessment - 02/04/21 0001       Observation/Other Assessments   Focus on Therapeutic Outcomes (FOTO)  91%                           [facility] Adult PT Treatment/Exercise - 02/04/21 0001       Exercises   Exercises Knee/Hip;Lumbar      Lumbar Exercises: Stretches   Lower Trunk Rotation Limitations knees wide x 10    Hip Flexor Stretch 3 reps;20 seconds   used Refomer   ITB Stretch 3 reps    ITB Stretch Limitations standing at doorway    Piriformis Stretch 2 reps;20 seconds      Lumbar Exercises: Supine   Dead Bug 5 reps    Dead Bug Limitations Arms at 90d    Bridge 15 reps    Bridge Limitations with abs engaged      Lumbar Exercises: Sidelying   Other Sidelying Lumbar Exercises L and R planks on knees 10x each                          PT Long Term Goals - 02/04/21 0914       PT LONG TERM GOAL #1   Title Pt will be I with HEP for stretching and strengthing hips and core    Baseline up to date    Time 5    Period Weeks    Status On-going    Target Date 04/08/21      PT LONG TERM GOAL #2   Title Pt will be able to reduce pain in leg(s) to < 2/10 most days of the week for improved sleep, rest    Baseline good today!    Time 5    Period Weeks    Status On-going    Target Date 02/04/21      PT LONG TERM GOAL #3   Title Patient will incrase strength in hips to 5/5 in hip extension and hip abduction bilaterally  Baseline Rt abd and ext 4+/5 , Lt abd 4+5, Lt hip ext 4-/5    Time 5    Period Weeks    Status On-going    Target Date 04/08/21      PT LONG TERM GOAL #4   Title Pt will be able attend social events without increasing leg pain for up to 3 hours.    Baseline unable to wear certain shoes, stand still for long periods    Time 5    Period Weeks    Status Achieved    Target Date 02/03/21      PT LONG TERM GOAL #5   Title FOTO score will improve to 89% to show improved functional mobility. 02/04/21=91%    Baseline 63%    Period Weeks    Status Achieved    Target Date 02/04/21                   Plan - 02/04/21 1019     Clinical Impression Statement Pt continues to report and demonstrate good improvement with her functional mobility. Pt's FOTO score exceeded the predicted value. Pt has been pain free for a week. Pt will continue to benefit from skilled PT to address lumbopelvic/hip mobility and strength to address pain and function. Will provide PT for 4 visits spread out over several weeks as needed to insure pt's progress continues.    Personal Factors and Comorbidities Time since onset of injury/illness/exacerbation    Examination-Activity Limitations Stand;Sit;Sleep    Examination-Participation Restrictions Occupation;Interpersonal Relationship     Stability/Clinical Decision Making Evolving/Moderate complexity    Clinical Decision Making Moderate    Rehab Potential Excellent    PT Frequency --   1x per 2 weeks   PT Duration 8 weeks    PT Treatment/Interventions ADLs/Self Care Home Management;Cryotherapy;Moist Heat;Manual techniques;Dry needling;Passive range of motion;Therapeutic exercise;Functional mobility training;Therapeutic activities    PT Next Visit Plan Address tight hip musculature with emphasis on R IT band. Continue to progress hip strengthening add core. check goals    PT Home Exercise Plan Access Code: [email or web address]    Consulted and Agree with Plan of Care Patient             Patient will benefit from skilled therapeutic intervention in order to improve the following deficits and impairments:  Pain, Postural dysfunction, Increased fascial restricitons, Decreased strength, Decreased range of motion  Visit Diagnosis: Pain in left leg - Plan: PT plan of care cert/re-cert  Abnormal posture - Plan: PT plan of care cert/re-cert  Pain in right leg - Plan: PT plan of care cert/re-cert  Muscle weakness (generalized) - Plan: PT plan of care cert/re-cert     Problem List Patient Active Problem List   Diagnosis Date Noted   Screening for cervical cancer 08/20/2017    Allen Ralls MS, PT 02/04/21 10:46 AM   [facility] [address] [place], [place], [address] Phone: [phone]   Fax:  [phone]  Name: Kahlyn C Westman MRN: [id number] Date of Birth: 06/16/1981  PHYSICAL THERAPY DISCHARGE SUMMARY  Visits from Start of Care: 7  Current functional level related to goals / functional outcomes: See above for status at last appt   Remaining deficits: See above for status at last appt   Education / Equipment: HEP  Patient agrees to discharge. Patient goals were partially met. Patient is being discharged due to not returning since the last visit.  Allen Ralls  MS, PT 03/30/21 8:50 AM    

## 2021-02-18 ENCOUNTER — Telehealth: Payer: Self-pay

## 2021-02-18 NOTE — Telephone Encounter (Signed)
Left VM re: 1st no show appt for 02/18/21. Reminded pt of upcoming appt on 02/2021.

## 2021-02-23 NOTE — Telephone Encounter (Signed)
Note opened in error.

## 2021-03-04 ENCOUNTER — Ambulatory Visit: Payer: BC Managed Care – PPO

## 2021-04-01 ENCOUNTER — Ambulatory Visit: Payer: BC Managed Care – PPO | Admitting: Rehabilitative and Restorative Service Providers"

## 2021-04-10 ENCOUNTER — Ambulatory Visit (INDEPENDENT_AMBULATORY_CARE_PROVIDER_SITE_OTHER): Payer: Self-pay

## 2021-04-10 ENCOUNTER — Telehealth: Payer: Self-pay | Admitting: Primary Care

## 2021-04-10 ENCOUNTER — Ambulatory Visit (INDEPENDENT_AMBULATORY_CARE_PROVIDER_SITE_OTHER): Payer: BC Managed Care – PPO | Admitting: Nurse Practitioner

## 2021-04-10 ENCOUNTER — Encounter: Payer: Self-pay | Admitting: Nurse Practitioner

## 2021-04-10 ENCOUNTER — Other Ambulatory Visit: Payer: Self-pay

## 2021-04-10 DIAGNOSIS — J0100 Acute maxillary sinusitis, unspecified: Secondary | ICD-10-CM

## 2021-04-10 MED ORDER — FLUCONAZOLE 150 MG PO TABS: 150.0000 mg | ORAL_TABLET | Freq: Once | ORAL | 0 refills | Status: AC

## 2021-04-10 MED ORDER — LEVOCETIRIZINE DIHYDROCHLORIDE 5 MG PO TABS: 5.0000 mg | ORAL_TABLET | Freq: Every evening | ORAL | 0 refills | Status: DC

## 2021-04-10 MED ORDER — AMOXICILLIN 875 MG PO TABS: 875.0000 mg | ORAL_TABLET | Freq: Two times a day (BID) | ORAL | 0 refills | Status: AC

## 2021-04-10 NOTE — Telephone Encounter (Signed)
Pt. Reports Friday started having non-productive cough, sore throat, runny nose. Home COVID 19 test negative. Warm transfer to [facility] at [facility] for appointment. No availability at PCP.

## 2021-04-10 NOTE — Telephone Encounter (Signed)
Reason for Disposition  [1] MILD difficulty breathing (e.g., minimal/no SOB at rest, SOB with walking, pulse <100) AND [2] still present when not coughing  Answer Assessment - Initial Assessment Questions 1. ONSET: "When did the cough begin?"      Friday 2. SEVERITY: "How bad is the cough today?"      Severe 3. SPUTUM: "Describe the color of your sputum" (none, dry cough; clear, white, yellow, green)     None 4. HEMOPTYSIS: "Are you coughing up any blood?" If so ask: "How much?" (flecks, streaks, tablespoons, etc.)     No 5. DIFFICULTY BREATHING: "Are you having difficulty breathing?" If Yes, ask: "How bad is it?" (e.g., mild, moderate, severe)    - MILD: No SOB at rest, mild SOB with walking, speaks normally in sentences, can lie down, no retractions, pulse < 100.    - MODERATE: SOB at rest, SOB with minimal exertion and prefers to sit, cannot lie down flat, speaks in phrases, mild retractions, audible wheezing, pulse 100-120.    - SEVERE: Very SOB at rest, speaks in single words, struggling to breathe, sitting hunched forward, retractions, pulse > 120      Mild 6. FEVER: "Do you have a fever?" If Yes, ask: "What is your temperature, how was it measured, and when did it start?"     No 7. CARDIAC HISTORY: "Do you have any history of heart disease?" (e.g., heart attack, congestive heart failure)      No 8. LUNG HISTORY: "Do you have any history of lung disease?"  (e.g., pulmonary embolus, asthma, emphysema)     No 9. PE RISK FACTORS: "Do you have a history of blood clots?" (or: recent major surgery, recent prolonged travel, bedridden)     No 10. OTHER SYMPTOMS: "Do you have any other symptoms?" (e.g., runny nose, wheezing, chest pain)       Runny nose, sore throat 11. PREGNANCY: "Is there any chance you are pregnant?" "When was your last menstrual period?"       No 12. TRAVEL: "Have you traveled out of the country in the last month?" (e.g., travel history, exposures)        No  Protocols used: Cough - Acute Non-Productive-A-AH

## 2021-04-10 NOTE — Patient Instructions (Signed)
Sinusitis:   Stay well hydrated  Stay active  Deep breathing exercises  May take tylenol for fever or pain  May take mucinex twice daily  Will order xyzal  Will order amoxicillin  Will order diflucan   Follow up:  Follow up  if needed

## 2021-04-10 NOTE — Progress Notes (Signed)
Virtual Visit via Telephone Note  I connected with Ashley Li on 04/10/21 at  9:50 AM EDT by telephone and verified that I am speaking with the correct person using two identifiers.  Location: Patient: home Provider: office   I discussed the limitations, risks, security and privacy concerns of performing an evaluation and management service by telephone and the availability of in person appointments. I also discussed with the patient that there may be a patient responsible charge related to this service. The patient expressed understanding and agreed to proceed.   History of Present Illness:  Patient presents today for acute visit through televisit.  Patient states that she has been having less pressure and pain, nasal drainage, sore throat that started on 04/07/2021.  Patient did take a home COVID test over the weekend that was negative.  She denies any significant fever or any significant shortness of breath.  She states that at times it is hard for her to breathe due to severe sinus congestion.  We discussed that we can order an antibiotic to cover for sinusitis.  Patient states that she is prone to developing yeast infections with antibiotics and we will order Diflucan as well.  We discussed that she can also start an antihistamine. Denies f/c/s, n/v/d, hemoptysis, PND, chest pain or edema.   Observations/Objective:  Vitals with BMI 11/16/2020 11/16/2020 08/19/2020  Height - 5' 2.5" -  Weight - 185 lbs 10 oz 187 lbs  BMI - 82.80 -  Systolic 034 917 915  Diastolic 96 056 97  Pulse 100 112 -      Assessment and Plan:  Sinusitis:   Stay well hydrated  Stay active  Deep breathing exercises  May take tylenol for fever or pain  May take mucinex twice daily  Will order xyzal  Will order amoxicillin  Will order diflucan   Follow up:  Follow up  if needed    I discussed the assessment and treatment plan with the patient. The patient was provided an opportunity to ask  questions and all were answered. The patient agreed with the plan and demonstrated an understanding of the instructions.   The patient was advised to call back or seek an in-person evaluation if the symptoms worsen or if the condition fails to improve as anticipated.  I provided 23 minutes of non-face-to-face time during this encounter.   Fenton Foy, NP

## 2021-04-10 NOTE — Telephone Encounter (Signed)
.  Ms. chiniqua, kilcrease are scheduled for a virtual visit with your provider today.    Just as we do with appointments in the office, we must obtain your consent to participate.  Your consent will be active for this visit and any virtual visit you may have with one of our providers in the next 365 days.    If you have a MyChart account, I can also send a copy of this consent to you electronically.  All virtual visits are billed to your insurance company just like a traditional visit in the office.  As this is a virtual visit, video technology does not allow for your provider to perform a traditional examination.  This may limit your provider's ability to fully assess your condition.  If your provider identifies any concerns that need to be evaluated in person or the need to arrange testing such as labs, EKG, etc, we will make arrangements to do so.    Although advances in technology are sophisticated, we cannot ensure that it will always work on either your end or our end.  If the connection with a video visit is poor, we may have to switch to a telephone visit.  With either a video or telephone visit, we are not always able to ensure that we have a secure connection.   I need to obtain your verbal consent now.   Are you willing to proceed with your visit today?   SUELLA COGAR has provided verbal consent on 04/10/2021 for a virtual visit (video or telephone).   Johny Shears 04/10/2021  9:20 AM

## 2021-06-12 ENCOUNTER — Ambulatory Visit (INDEPENDENT_AMBULATORY_CARE_PROVIDER_SITE_OTHER): Payer: Self-pay

## 2021-06-12 NOTE — Telephone Encounter (Signed)
2nd attempt to reach pt, left VM to call back. 

## 2021-06-12 NOTE — Telephone Encounter (Signed)
3 attempts to reach pt.. left VM to CB each attempt. Routing to provider for resolution per protocol.

## 2021-06-12 NOTE — Telephone Encounter (Signed)
Pt is calling for advice she has a rash under arm and is wanting advice pt is also wanting STD testing. And there are no available appts.   Left message to call back.

## 2021-06-13 NOTE — Telephone Encounter (Signed)
Left message for patient to return call to office to schedule an appointment to be evaluated.

## 2021-06-30 ENCOUNTER — Other Ambulatory Visit: Payer: Self-pay

## 2021-06-30 ENCOUNTER — Encounter (INDEPENDENT_AMBULATORY_CARE_PROVIDER_SITE_OTHER): Payer: Self-pay | Admitting: Primary Care

## 2021-06-30 ENCOUNTER — Ambulatory Visit (INDEPENDENT_AMBULATORY_CARE_PROVIDER_SITE_OTHER): Payer: BLUE CROSS/BLUE SHIELD | Admitting: Primary Care

## 2021-06-30 ENCOUNTER — Other Ambulatory Visit (HOSPITAL_COMMUNITY)
Admission: RE | Admit: 2021-06-30 | Discharge: 2021-06-30 | Disposition: A | Discharge status: Home / Self Care | Payer: BLUE CROSS/BLUE SHIELD | Source: Ambulatory Visit | Attending: Primary Care | Admitting: Primary Care

## 2021-06-30 VITALS — BP 139/88 | HR 105 | Temp 97.5°F | Ht 62.0 in | Wt 185.2 lb

## 2021-06-30 DIAGNOSIS — L732 Hidradenitis suppurativa: Secondary | ICD-10-CM | POA: Diagnosis not present

## 2021-06-30 DIAGNOSIS — E782 Mixed hyperlipidemia: Secondary | ICD-10-CM

## 2021-06-30 DIAGNOSIS — R030 Elevated blood-pressure reading, without diagnosis of hypertension: Secondary | ICD-10-CM | POA: Diagnosis not present

## 2021-06-30 DIAGNOSIS — Z113 Encounter for screening for infections with a predominantly sexual mode of transmission: Secondary | ICD-10-CM | POA: Diagnosis present

## 2021-06-30 MED ORDER — ATORVASTATIN CALCIUM 40 MG PO TABS: 40.0000 mg | ORAL_TABLET | Freq: Every day | ORAL | 3 refills | Status: DC

## 2021-06-30 NOTE — Progress Notes (Signed)
History of Present Illness   Patient Identification Ashley Li is a 40 y.o. female.  Patient information was obtained from patient. History/Exam limitations: none.  Chief Complaint  Rash (Under arm) and STD testing  Patient presents without a complaint of vaginal discharge.She does not complain of burning with urination. She denies genital lesions at this time. The patient does not  have a history of STD's or PID and does not have multiple sexual partners. She is not homosexual. The patient is HIV negative.   No past medical history on file. No family history on file. Scheduled Meds: Continuous Infusions: PRN Meds:  No Known Allergies Social History   Socioeconomic History   Marital status: Single    Spouse name: Not on file   Number of children: Not on file   Years of education: Not on file   Highest education level: Not on file  Occupational History   Not on file  Tobacco Use   Smoking status: Never   Smokeless tobacco: Never  Substance and Sexual Activity   Alcohol use: Yes    Comment: socially    Drug use: No   Sexual activity: Yes    Birth control/protection: Inserts  Other Topics Concern   Not on file  Social History Narrative   Not on file   Social Determinants of Health   Financial Resource Strain: Not on file  Food Insecurity: Not on file  Transportation Needs: Not on file  Physical Activity: Not on file  Stress: Not on file  Social Connections: Not on file  Intimate Partner Violence: Not on file   Review of Systems Pertinent items noted in HPI and remainder of comprehensive ROS otherwise negative.   BP 139/88 (BP Location: Right Arm, Patient Position: Sitting, Cuff Size: Normal)    Pulse (!) 105    Temp (!) 97.5 F (36.4 C) (Temporal)    Ht 5\' 2"  (1.575 m)    Wt 185 lb 3.2 oz (84 kg)    LMP 06/12/2021 (Approximate)    SpO2 97%    BMI 33.87 kg/m  Physical exam: General: Vital signs reviewed.  Patient is well-developed and well-nourished,  obese female in no acute distress and cooperative with exam. Head: Normocephalic and atraumatic. Eyes: EOMI, conjunctivae normal, no scleral icterus. Neck: Supple, trachea midline, normal ROM, no JVD, masses, thyromegaly, or carotid bruit present. Cardiovascular: RRR, S1 normal, S2 normal, no murmurs, gallops, or rubs. Pulmonary/Chest: Clear to auscultation bilaterally, no wheezes, rales, or rhonchi. Abdominal: Soft, non-tender, non-distended, BS +, no masses, organomegaly, or guarding present. Musculoskeletal: No joint deformities, erythema, or stiffness, ROM full and nontender. Extremities: No lower extremity edema bilaterally,  pulses symmetric and intact bilaterally. No cyanosis or clubbing. Neurological: A&O x3, Strength is normal Skin: Warm, dry and intact. No rashes or erythema. Psychiatric: Normal mood and affect. speech and behavior is normal. Cognition and memory are normal.    Ashley Li was seen today for rash and std testing.  Diagnoses and all orders for this visit:  Hidradenitis suppurativa of right axilla Discuss the dx for the rash under her arm and tx option. Also provided on AVS  Screening for STD (sexually transmitted disease) -     Cervicovaginal ancillary only  Elevated blood pressure reading Counseled on blood pressure goal of less than 130/80, low-sodium, DASH diet, medication compliance, 150 minutes of moderate intensity exercise per week. Discussed medication compliance, adverse effects.   Mixed hyperlipidemia  Healthy lifestyle diet of fruits vegetables fish nuts whole grains and  low saturated fat . Foods high in cholesterol or liver, fatty meats,cheese, butter avocados, nuts and seeds, chocolate and fried foods. -     atorvastatin (LIPITOR) 40 MG tablet; Take 1 tablet (40 mg total) by mouth daily.  Kerin Perna

## 2021-06-30 NOTE — Patient Instructions (Addendum)
Hidradenitis Suppurativa Hidradenitis suppurativa is a long-term (chronic) skin disease. It is similar to a severe form of acne, but it affects areas of the body where acne would be unusual, especially areas of the body where skin rubs against skin and becomes moist. These include: Underarms. Groin. Genital area. Buttocks. Upper thighs. Breasts. Hidradenitis suppurativa may start out as small lumps or pimples caused by blocked sweat glands or hair follicles. Pimples may develop into deep sores that break open (rupture) and drain pus. Over time, affected areas of skin may thicken and become scarred. This condition is rare and does not spread from person to person (non-contagious). What are the causes?Preventing Hypertension Hypertension, also called high blood pressure, is when the force of blood pumping through the arteries is too strong. Arteries are blood vessels that carry blood from the heart throughout the body. Often, hypertension does not cause symptoms until blood pressure is very high. It is important to have your blood pressure checked regularly. Diet and lifestyle changes can help you prevent hypertension, and they may make you feel better overall and improve your quality of life. If you already have hypertension, you may control it with diet and lifestyle changes, as well as with medicine. How can this condition affect me? Over time, hypertension can damage the arteries and decrease blood flow to important parts of the body, including the brain, heart, and kidneys. By keeping your blood pressure in a healthy range, you can help prevent complications like heart attack, heart failure, stroke, kidney failure, and vascular dementia. What can increase my risk? Being an older adult. Older people are more often affected. Having family members who have had high blood pressure. Being obese. Being female. Males are more likely to have high blood pressure. Drinking too much alcohol or  caffeine. Smoking or using illegal drugs. Taking certain medicines, such as antidepressants, decongestants, birth control pills, and NSAIDs, such as ibuprofen. Having thyroid problems. Having certain tumors. What actions can I take to prevent or manage this condition? Work with your health care provider to make a hypertension prevention plan that works for you. Follow your plan and keep all follow-up visits as told by your health care provider. Diet changes Maintain a healthy diet. This includes: Eating less salt (sodium). Ask your health care provider how much sodium is safe for you to have. The general recommendation is to have less than 1 tsp (2,300 mg) of sodium a day. Do not add salt to your food. Choose low-sodium options when grocery shopping and eating out. Limiting fats in your diet. You can do this by eating low-fat or fat-free dairy products and by eating less red meat. Eating more fruits, vegetables, and whole grains. Make a goal to eat: 1-2 cups of fresh fruits and vegetables each day. 3-4 servings of whole grains each day. Avoiding foods and beverages that have added sugars. Eating fish that contain healthy fats (omega-3 fatty acids), such as mackerel or salmon. If you need help putting together a healthy eating plan, try the DASH diet. This diet is high in fruits, vegetables, and whole grains. It is low in sodium, red meat, and added sugars. DASH stands for Dietary Approaches to Stop Hypertension. Lifestyle changes Lose weight if you are overweight. Losing just 3?5% of your body weight can help prevent or control hypertension. For example, if your present weight is 200 lb (91 kg), a loss of 3-5% of your weight means losing 6-10 lb (2.7-4.5 kg). Ask your health care provider to help  you with a diet and exercise plan to safely lose weight. Other recommendations usually include: Get enough exercise. Do at least 150 minutes of moderate-intensity exercise each week. You could do this  in short exercise sessions several times a day, or you could do longer exercise sessions a few times a week. For example, you could take a brisk 10-minute walk or bike ride, 3 times a day, for 5 days a week. Find ways to reduce stress, such as exercising, meditating, listening to music, or taking a yoga class. If you need help reducing stress, ask your health care provider. Do not use any products that contain nicotine or tobacco, such as cigarettes, e-cigarettes, and chewing tobacco. If you need help quitting, ask your health care provider. Chemicals in tobacco and nicotine products raise your blood pressure each time you use them. If you need help quitting, ask your health care provider. Learn how to check your blood pressure at home. Make sure that you know your personal target blood pressure, as told by your health care provider. Try to sleep 7-9 hours per night.  Alcohol use Do not drink alcohol if: Your health care provider tells you not to drink. You are pregnant, may be pregnant, or are planning to become pregnant. If you drink alcohol: Limit how much you use to: 0-1 drink a day for women. 0-2 drinks a day for men. Be aware of how much alcohol is in your drink. In the [place] one drink equals one 12 oz bottle of beer (355 mL), one 5 oz glass of wine (148 mL), or one 1 oz glass of hard liquor (44 mL). Medicines In addition to diet and lifestyle changes, your health care provider may recommend medicines to help lower your blood pressure. In general: You may need to try a few different medicines to find what works best for you. You may need to take more than one medicine. Take over-the-counter and prescription medicines only as told by your health care provider. Questions to ask your health care provider What is my blood pressure goal? How can I lower my risk for high blood pressure? How should I monitor my blood pressure at home? Where to find support Your health care provider can help  you prevent hypertension and help you keep your blood pressure at a healthy level. Your local hospital or your community may also provide support services and prevention programs. The American Heart Association offers an online support network at supportnetwork.heart.org Where to find more information Learn more about hypertension from: [place], Lung, and [place]: https://wilson-eaton.com/ Centers for Disease Control and Prevention: http://www.wolf.info/ American Academy of Family Physicians: familydoctor.org Learn more about the DASH diet from: [place], Lung, and [place]: https://wilson-eaton.com/ Contact a health care provider if: You think you are having a reaction to medicines you have taken. You have recurrent headaches or feel dizzy. You have swelling in your ankles. You have trouble with your vision. Get help right away if: You have sudden, severe chest, back, or abdominal pain or discomfort. You have shortness of breath. You have a sudden, severe headache. These symptoms may represent a serious problem that is an emergency. Do not wait to see if the symptoms will go away. Get medical help right away. Call your local emergency services (911 in the [place] Do not drive yourself to the hospital.  Summary Hypertension often does not cause any symptoms until blood pressure is very high. It is important to get your blood pressure checked regularly. Diet  and lifestyle changes are important steps in preventing hypertension. By keeping your blood pressure in a healthy range, you may prevent complications like heart attack, heart failure, stroke, and kidney failure. Work with your health care provider to make a hypertension prevention plan that works for you. This information is not intended to replace advice given to you by your health care provider. Make sure you discuss any questions you have with your health care provider. Document Revised: 06/02/2019 Document Reviewed:  06/02/2019 Elsevier Patient Education  2022 [employer].  The exact cause of this condition is not known. It may be related to: Female and female hormones. An overactive disease-fighting system (immune system). The immune system may over-react to blocked hair follicles or sweat glands and cause swelling and pus-filled sores. What increases the risk? You are more likely to develop this condition if you: Are female. Are 50-21 years old. Have a family history of hidradenitis suppurativa. Have a personal history of acne. Are overweight. Smoke. Take the medicine lithium. What are the signs or symptoms? The first symptoms are usually painful bumps in the skin, similar to pimples. The condition may get worse over time (progress), or it may only cause mild symptoms. If the disease progresses, symptoms may include: Skin bumps getting bigger and growing deeper into the skin. Bumps rupturing and draining pus. Itchy, infected skin. Skin getting thicker and scarred. Tunnels under the skin (fistulas) where pus drains from a bump. Pain during daily activities, such as pain during walking if your groin area is affected. Emotional problems, such as stress or depression. This condition may affect your appearance and your ability or willingness to wear certain clothes or do certain activities. How is this diagnosed? This condition is diagnosed by a health care provider who specializes in skin diseases (dermatologist). You may be diagnosed based on: Your symptoms and medical history. A physical exam. Testing a pus sample for infection. Blood tests. How is this treated? Your treatment will depend on how severe your symptoms are. The same treatment will not work for everybody with this condition. You may need to try several treatments to find what works best for you. Treatment may include: Cleaning and bandaging (dressing) your wounds as needed. Lifestyle changes, such as new skin care routines. Taking  medicines, such as: Antibiotics. Acne medicines. Medicines to reduce the activity of the immune system. A diabetes medicine (metformin). Birth control pills, for women. Steroids to reduce swelling and pain. Working with a mental health care provider, if you experience emotional distress due to this condition. If you have severe symptoms that do not get better with medicine, you may need surgery. Surgery may involve: Using a laser to clear the skin and remove hair follicles. Opening and draining deep sores. Removing the areas of skin that are diseased and scarred. Follow these instructions at home: Medicines  Take over-the-counter and prescription medicines only as told by your health care provider. If you were prescribed an antibiotic medicine, take it as told by your health care provider. Do not stop taking the antibiotic even if your condition improves. Skin care If you have open wounds, cover them with a clean dressing as told by your health care provider. Keep wounds clean by washing them gently with soap and water when you bathe. Do not shave the areas where you get hidradenitis suppurativa. Do not wear deodorant. Wear loose-fitting clothes. Try to avoid getting overheated or sweaty. If you get sweaty or wet, change into clean, dry clothes as soon as  you can. To help relieve pain and itchiness, cover sore areas with a warm, clean washcloth (warm compress) for 5-10 minutes as often as needed. If told by your health care provider, take a bleach bath twice a week: Fill your bathtub halfway with water. Pour in  cup of unscented household bleach. Soak in the tub for 5-10 minutes. Only soak from the neck down. Avoid water on your face and hair. Shower to rinse off the bleach from your skin. General instructions Learn as much as you can about your disease so that you have an active role in your treatment. Work closely with your health care provider to find treatments that work for  you. If you are overweight, work with your health care provider to lose weight as recommended. Do not use any products that contain nicotine or tobacco, such as cigarettes and e-cigarettes. If you need help quitting, ask your health care provider. If you struggle with living with this condition, talk with your health care provider or work with a mental health care provider as recommended. Keep all follow-up visits as told by your health care provider. This is important. Where to find more information Hidradenitis Mina.: https://www.hs-foundation.org/ American Academy of Dermatology: http://www.nguyen-hutchinson.com/ Contact a health care provider if you have: A flare-up of hidradenitis suppurativa. A fever or chills. Trouble controlling your symptoms at home. Trouble doing your daily activities because of your symptoms. Trouble dealing with emotional problems related to your condition. Summary Hidradenitis suppurativa is a long-term (chronic) skin disease. It is similar to a severe form of acne, but it affects areas of the body where acne would be unusual. The first symptoms are usually painful bumps in the skin, similar to pimples. The condition may only cause mild symptoms, or it may get worse over time (progress). If you have open wounds, cover them with a clean dressing as told by your health care provider. Keep wounds clean by washing them gently with soap and water when you bathe. Besides skin care, treatment may include medicines, laser treatment, and surgery. This information is not intended to replace advice given to you by your health care provider. Make sure you discuss any questions you have with your health care provider. Document Revised: 04/26/2020 Document Reviewed: 04/26/2020 Elsevier Patient Education  2022 [employer].

## 2021-07-05 LAB — CERVICOVAGINAL ANCILLARY ONLY
Bacterial Vaginitis (gardnerella): POSITIVE — AB
Candida Glabrata: NEGATIVE
Candida Vaginitis: NEGATIVE
Chlamydia: NEGATIVE
Comment: NEGATIVE
Comment: NEGATIVE
Comment: NEGATIVE
Comment: NEGATIVE
Comment: NEGATIVE
Comment: NORMAL
Neisseria Gonorrhea: NEGATIVE
Trichomonas: POSITIVE — AB

## 2021-07-06 ENCOUNTER — Other Ambulatory Visit (INDEPENDENT_AMBULATORY_CARE_PROVIDER_SITE_OTHER): Payer: Self-pay | Admitting: Primary Care

## 2021-07-06 ENCOUNTER — Telehealth (INDEPENDENT_AMBULATORY_CARE_PROVIDER_SITE_OTHER): Payer: Self-pay

## 2021-07-06 DIAGNOSIS — B379 Candidiasis, unspecified: Secondary | ICD-10-CM

## 2021-07-06 DIAGNOSIS — A599 Trichomoniasis, unspecified: Secondary | ICD-10-CM

## 2021-07-06 MED ORDER — METRONIDAZOLE 500 MG PO TABS: 500.0000 mg | ORAL_TABLET | Freq: Two times a day (BID) | ORAL | 0 refills | Status: DC

## 2021-07-06 MED ORDER — FLUCONAZOLE 150 MG PO TABS: 150.0000 mg | ORAL_TABLET | Freq: Once | ORAL | 0 refills | Status: AC

## 2021-07-06 NOTE — Telephone Encounter (Signed)
Copied from [place] [phone]. Topic: General - Other >> Jul 06, 2021  8:43 AM McGill, Nelva Bush wrote: Reason for CRM: Pt requesting to speak with a nurse to discuss her recent lab work from 07/05/2021 .  Please advice.   Please view results. And address for patient. Nat Christen, CMA

## 2021-09-28 ENCOUNTER — Other Ambulatory Visit: Payer: Self-pay

## 2021-09-28 ENCOUNTER — Ambulatory Visit (INDEPENDENT_AMBULATORY_CARE_PROVIDER_SITE_OTHER): Payer: BLUE CROSS/BLUE SHIELD | Admitting: Primary Care

## 2021-09-28 ENCOUNTER — Encounter (INDEPENDENT_AMBULATORY_CARE_PROVIDER_SITE_OTHER): Payer: Self-pay | Admitting: Primary Care

## 2021-09-28 VITALS — BP 158/102 | HR 89 | Temp 98.3°F | Ht 62.0 in | Wt 189.8 lb

## 2021-09-28 DIAGNOSIS — I10 Essential (primary) hypertension: Secondary | ICD-10-CM | POA: Diagnosis not present

## 2021-09-28 MED ORDER — AMLODIPINE BESYLATE 5 MG PO TABS: 5.0000 mg | ORAL_TABLET | Freq: Every day | ORAL | 1 refills | Status: DC

## 2021-09-28 NOTE — Progress Notes (Signed)
?[place] ? ?Ashley Li, is a 41 y.o. female ? ?[id number] ? ?[id number] ? ?DOB - May 12, 1981 ? ?Chief Complaint  ?Patient presents with  ? Blood Pressure Check  ? weight management   ?    ? ?Subjective:  ? ?Ashley Li is a 41 y.o. female here today for a follow up visit on Bp. We decided that she would try managing her Bp on diet change and excessive IF no improvement she agreed to start taking bp medication. Explain previously HTN can be genetics.  Patient has No headache, No chest pain, No abdominal pain - No Nausea, No new weakness tingling or numbness, No Cough shortness of breath.  ? ?No problems updated. ? ?No Known Allergies ? ?No past medical history on file. ? ?Current Outpatient Medications on File Prior to Visit  ?Medication Sig Dispense Refill  ? atorvastatin (LIPITOR) 40 MG tablet Take 1 tablet (40 mg total) by mouth daily. 90 tablet 3  ? etonogestrel (NEXPLANON) 68 MG IMPL implant 1 each by Subdermal route once.    ? levocetirizine (XYZAL ALLERGY 24HR) 5 MG tablet Take 1 tablet (5 mg total) by mouth every evening. 30 tablet 0  ? hydrochlorothiazide (HYDRODIURIL) 25 MG tablet Take 1 tablet (25 mg total) by mouth daily. (Patient not taking: Reported on 09/28/2021) 90 tablet 3  ? Multiple Vitamin (MULTIVITAMIN) capsule Take 1 capsule by mouth daily.    ? ?No current facility-administered medications on file prior to visit.  ? ? ?Objective:  ? ?Vitals:  ? 09/28/21 1600 09/28/21 1611  ?BP: (!) 153/103 (!) 158/102  ?Pulse: 86 89  ?Temp: 98.3 ?F (36.8 ?C)   ?TempSrc: Oral   ?SpO2: 96%   ?Weight: 189 lb 12.8 oz (86.1 kg)   ?Height: '5\' 2"'$  (1.575 m)   ? ? ?Exam ?General appearance : Awake, alert, not in any distress. Speech Clear. Not toxic looking ?HEENT: Atraumatic and Normocephalic, pupils equally reactive to light and accomodation ?Neck: Supple, no JVD. No cervical lymphadenopathy.  ?Chest: Good air entry bilaterally, no added sounds  ?CVS: S1 S2 regular, no murmurs.   ?Abdomen: Bowel sounds present, Non tender and not distended with no gaurding, rigidity or rebound. ?Extremities: B/L Lower Ext shows no edema, both legs are warm to touch ?Neurology: Awake alert, and oriented X 3, CN II-XII intact, Non focal ?Skin: No Rash ? ?Data Review ?No results found for: HGBA1C ? ?Assessment & Plan  ?Ashley Li was seen today for blood pressure check and weight management . ? ?Diagnoses and all orders for this visit: ? ?Essential hypertension ?BP goal - < 130/80 ?Explained that having normal blood pressure is the goal and medications are helping to get to goal and maintain normal blood pressure. ?DIET: Limit salt intake, read nutrition labels to check salt content, limit fried and high fatty foods  ?Avoid using multisymptom OTC cold preparations that generally contain sudafed which can rise BP. Consult with pharmacist on best cold relief products to use for persons with HTN ?EXERCISE ?Discussed incorporating exercise such as walking - 30 minutes most days of the week and can do in 10 minute intervals    ?-     amLODipine (NORVASC) 5 MG tablet; Take 1 tablet (5 mg total) by mouth daily.plus HCTZ '25mg'$  daily. ? ?  ?There are no diagnoses linked to this encounter. ? ? ?Patient have been counseled extensively about nutrition and exercise. Other issues discussed during this visit include: low cholesterol diet, weight control and daily exercise, foot care, annual  eye examinations at Ophthalmology, importance of adherence with medications and regular follow-up. We also discussed long term complications of uncontrolled diabetes and hypertension.  ? ? ? ?The patient was given clear instructions to go to ER or return to medical center if symptoms don't improve, worsen or new problems develop. The patient verbalized understanding. The patient was told to call to get lab results if they haven't heard anything in the next week.  ? ?This note has been created with Human resources officer. Any transcriptional errors are unintentional.  ? ?Kerin Perna, NP ?09/28/2021, 4:14 PM  ?

## 2021-09-28 NOTE — Patient Instructions (Signed)
Amlodipine Solution ?What is this medication? ?AMLODIPINE (am LOE di peen) treats high blood pressure and prevents chest pain (angina). It works by relaxing the blood vessels, which helps decrease the amount of work your heart has to do. It belongs to a group of medications called calcium channel blockers. ?This medicine may be used for other purposes; ask your health care provider or pharmacist if you have questions. ?COMMON BRAND NAME(S): Norliqva ?What should I tell my care team before I take this medication? ?They need to know if you have any of these conditions: ?Heart disease ?Liver disease ?An unusual or allergic reaction to amlodipine, other medications, foods, dyes, or preservatives ?Pregnant or trying to get pregnant ?Breast-feeding ?How should I use this medication? ?Take this medication by mouth. Take it as directed on the prescription label at the same time every day. Use a specially marked oral syringe, spoon, or dropper to measure each dose. Ask your pharmacist if you do not have one. Household spoons are not accurate. You can take it with or without food. If it upsets your stomach, take it with food. Keep taking it unless your care team tells you to stop. ?Talk to your care team about the use of this medication in children. While it may be prescribed to children as young as 6 years for selected conditions, precautions do apply. ?Overdosage: If you think you have taken too much of this medicine contact a poison control center or emergency room at once. ?NOTE: This medicine is only for you. Do not share this medicine with others. ?What if I miss a dose? ?If you miss a dose, take it as soon as you can. If it is almost time for your next dose, take only that dose. Do not take double or extra doses. ?What may interact with this medication? ?Clarithromycin ?Cyclosporine ?Diltiazem ?Itraconazole ?Simvastatin ?Tacrolimus ?This list may not describe all possible interactions. Give your health care provider a  list of all the medicines, herbs, non-prescription drugs, or dietary supplements you use. Also tell them if you smoke, drink alcohol, or use illegal drugs. Some items may interact with your medicine. ?What should I watch for while using this medication? ?Visit your care team for regular checks on your progress. Check your blood pressure as directed. Ask your care team what your blood pressure should be. Also, find out when you should contact them. ?Do not treat yourself for coughs, colds, or pain while you are using this medication without asking your care team for advice. Some medications may increase your blood pressure. ?You may get drowsy or dizzy. Do not drive, use machinery, or do anything that needs mental alertness until you know how this medication affects you. Do not stand up or sit up quickly, especially if you are an older patient. This reduces the risk of dizzy or fainting spells. Alcohol can make you more drowsy and dizzy. Avoid alcoholic drinks. ?What side effects may I notice from receiving this medication? ?Side effects that you should report to your care team as soon as possible: ?Allergic reactions--skin rash, itching, hives, swelling of the face, lips, tongue, or throat ?Heart attack--pain or tightness in the chest, shoulders, arms, or jaw, nausea, shortness of breath, cold or clammy skin, feeling faint or lightheaded ?Low blood pressure--dizziness, feeling faint or lightheaded, blurry vision ?Side effects that usually do not require medical attention (report these to your care team if they continue or are bothersome): ?Facial flushing, redness ?Heart palpitations--rapid, pounding, or irregular heartbeat ?Nausea ?Stomach pain ?Swelling  of the ankles, hands, or feet ?This list may not describe all possible side effects. Call your doctor for medical advice about side effects. You may report side effects to FDA at 1-800-FDA-1088. ?Where should I keep my medication? ?Keep out of the reach of children  and pets. ?Store at room temperature between 20 and 25 degrees C (68 and 77 degrees F). Keep this medication in the original container. Get rid of any unused medication after the expiration date. ?To get rid of medications that are no longer needed or have expired: ?Take the medication to a medication take-back program. Check with your pharmacy or law enforcement to find a location. ?If you cannot return the medication, check the label or package insert to see if the medication should be thrown out in the garbage or flushed down the toilet. If you are not sure, ask your care team. If it is safe to put it in the trash, pour the medication out of the container. Mix the medication with cat litter, dirt, coffee grounds, or other unwanted substance. Seal the mixture in a bag or container. Put it in the trash. ?NOTE: This sheet is a summary. It may not cover all possible information. If you have questions about this medicine, talk to your doctor, pharmacist, or health care provider. ?? 2022 Elsevier/Gold Standard (2020-11-22 00:00:00) ? ?

## 2021-10-13 DIAGNOSIS — H5213 Myopia, bilateral: Secondary | ICD-10-CM | POA: Diagnosis not present

## 2021-11-03 ENCOUNTER — Ambulatory Visit (INDEPENDENT_AMBULATORY_CARE_PROVIDER_SITE_OTHER): Payer: Medicaid Other | Admitting: Primary Care

## 2021-11-03 ENCOUNTER — Encounter (INDEPENDENT_AMBULATORY_CARE_PROVIDER_SITE_OTHER): Payer: Self-pay | Admitting: Primary Care

## 2021-11-03 VITALS — BP 119/82 | HR 80 | Temp 98.2°F | Ht 62.0 in | Wt 187.0 lb

## 2021-11-03 DIAGNOSIS — I10 Essential (primary) hypertension: Secondary | ICD-10-CM | POA: Diagnosis not present

## 2021-11-03 DIAGNOSIS — Z6834 Body mass index (BMI) 34.0-34.9, adult: Secondary | ICD-10-CM | POA: Diagnosis not present

## 2021-11-03 DIAGNOSIS — J302 Other seasonal allergic rhinitis: Secondary | ICD-10-CM | POA: Diagnosis not present

## 2021-11-03 DIAGNOSIS — E782 Mixed hyperlipidemia: Secondary | ICD-10-CM | POA: Diagnosis not present

## 2021-11-03 MED ORDER — LEVOCETIRIZINE DIHYDROCHLORIDE 5 MG PO TABS: 5.0000 mg | ORAL_TABLET | Freq: Every evening | ORAL | 1 refills | Status: AC

## 2021-11-03 MED ORDER — AMLODIPINE BESYLATE 5 MG PO TABS: 5.0000 mg | ORAL_TABLET | Freq: Every day | ORAL | 1 refills | Status: DC

## 2021-11-03 MED ORDER — HYDROCHLOROTHIAZIDE 25 MG PO TABS: 25.0000 mg | ORAL_TABLET | Freq: Every day | ORAL | 3 refills | Status: DC

## 2021-11-03 MED ORDER — FLUTICASONE PROPIONATE 50 MCG/ACT NA SUSP: 2.0000 | Freq: Every day | NASAL | 6 refills | Status: DC

## 2021-11-04 LAB — CMP14+EGFR
ALT: 21 IU/L (ref 0–32)
AST: 16 IU/L (ref 0–40)
Albumin/Globulin Ratio: 2 (ref 1.2–2.2)
Albumin: 4.7 g/dL (ref 3.8–4.8)
Alkaline Phosphatase: 63 IU/L (ref 44–121)
BUN/Creatinine Ratio: 13 (ref 9–23)
BUN: 11 mg/dL (ref 6–20)
Bilirubin Total: 0.4 mg/dL (ref 0.0–1.2)
CO2: 25 mmol/L (ref 20–29)
Calcium: 10.4 mg/dL — ABNORMAL HIGH (ref 8.7–10.2)
Chloride: 102 mmol/L (ref 96–106)
Creatinine, Ser: 0.87 mg/dL (ref 0.57–1.00)
Globulin, Total: 2.4 g/dL (ref 1.5–4.5)
Glucose: 107 mg/dL — ABNORMAL HIGH (ref 70–99)
Potassium: 3.7 mmol/L (ref 3.5–5.2)
Sodium: 143 mmol/L (ref 134–144)
Total Protein: 7.1 g/dL (ref 6.0–8.5)
eGFR: 87 mL/min/{1.73_m2} (ref >59)

## 2021-11-04 LAB — CBC WITH DIFFERENTIAL/PLATELET
Basophils Absolute: 0 10*3/uL (ref 0.0–0.2)
Basos: 1 %
EOS (ABSOLUTE): 0.3 10*3/uL (ref 0.0–0.4)
Eos: 6 %
Hematocrit: 43.9 % (ref 34.0–46.6)
Hemoglobin: 14.4 g/dL (ref 11.1–15.9)
Immature Grans (Abs): 0 10*3/uL (ref 0.0–0.1)
Immature Granulocytes: 0 %
Lymphocytes Absolute: 1.7 10*3/uL (ref 0.7–3.1)
Lymphs: 38 %
MCH: 29.3 pg (ref 26.6–33.0)
MCHC: 32.8 g/dL (ref 31.5–35.7)
MCV: 89 fL (ref 79–97)
Monocytes Absolute: 0.4 10*3/uL (ref 0.1–0.9)
Monocytes: 8 %
Neutrophils Absolute: 2.1 10*3/uL (ref 1.4–7.0)
Neutrophils: 47 %
Platelets: 398 10*3/uL (ref 150–450)
RBC: 4.92 x10E6/uL (ref 3.77–5.28)
RDW: 12.9 % (ref 11.7–15.4)
WBC: 4.4 10*3/uL (ref 3.4–10.8)

## 2021-11-04 LAB — LIPID PANEL
Chol/HDL Ratio: 4.3 ratio (ref 0.0–4.4)
Cholesterol, Total: 201 mg/dL — ABNORMAL HIGH (ref 100–199)
HDL: 47 mg/dL (ref >39)
LDL Chol Calc (NIH): 134 mg/dL — ABNORMAL HIGH (ref 0–99)
Triglycerides: 110 mg/dL (ref 0–149)
VLDL Cholesterol Cal: 20 mg/dL (ref 5–40)

## 2021-11-04 LAB — TSH+FREE T4
Free T4: 1.22 ng/dL (ref 0.82–1.77)
TSH: 1.57 u[IU]/mL (ref 0.450–4.500)

## 2021-11-06 ENCOUNTER — Encounter (INDEPENDENT_AMBULATORY_CARE_PROVIDER_SITE_OTHER): Payer: Self-pay | Admitting: Primary Care

## 2021-11-06 ENCOUNTER — Other Ambulatory Visit (INDEPENDENT_AMBULATORY_CARE_PROVIDER_SITE_OTHER): Payer: Self-pay | Admitting: Primary Care

## 2021-11-06 DIAGNOSIS — E782 Mixed hyperlipidemia: Secondary | ICD-10-CM

## 2021-11-06 MED ORDER — ATORVASTATIN CALCIUM 40 MG PO TABS: 40.0000 mg | ORAL_TABLET | Freq: Every day | ORAL | 3 refills | Status: DC

## 2021-11-06 NOTE — Progress Notes (Signed)
?Ashley Li ? ?Ashley Li, is a 41 y.o. female ? ?[id number] ? ?[id number] ? ?DOB - 11/16/1980 ? ?Chief Complaint  ?Patient presents with  ? Follow-up  ?  HTN  ?    ? ?Subjective:  ? ?Ashley Li is a 41 y.o. female here today for a follow up visit. Patient has No headache, No chest pain, No abdominal pain - No Nausea, No new weakness tingling or numbness, No Cough - shortness of breath ? ?No problems updated. ? ?No Known Allergies ? ?History reviewed. No pertinent past medical history. ? ?Current Outpatient Medications on File Prior to Visit  ?Medication Sig Dispense Refill  ? etonogestrel (NEXPLANON) 68 MG IMPL implant 1 each by Subdermal route once.    ? Multiple Vitamin (MULTIVITAMIN) capsule Take 1 capsule by mouth daily.    ? ?No current facility-administered medications on file prior to visit.  ? ? ?Objective:  ? ?Vitals:  ? 11/03/21 1112  ?BP: 119/82  ?Pulse: 80  ?Temp: 98.2 ?F (36.8 ?C)  ?TempSrc: Oral  ?SpO2: 97%  ?Weight: 187 lb (84.8 kg)  ?Height: 5' 2"  (1.575 m)  ? ? ?Exam ?General appearance : Awake, alert, not in any distress. Speech Clear. Not toxic looking ?HEENT: Atraumatic and Normocephalic, pupils equally reactive to light and accomodation ?Neck: Supple, no JVD. No cervical lymphadenopathy.  ?Chest: Good air entry bilaterally, no added sounds  ?CVS: S1 S2 regular, no murmurs.  ?Abdomen: Bowel sounds present, Non tender and not distended with no gaurding, rigidity or rebound. ?Extremities: B/L Lower Ext shows no edema, both legs are warm to touch ?Neurology: Awake alert, and oriented X 3, CN II-XII intact, Non focal ?Skin: No Rash ? ?Data Review ?No results found for: HGBA1C ? ?Assessment & Plan  ? ?1. Essential hypertension ?BP is at  goal - < 130/80 ?Explained that having normal blood pressure is the goal and medications are helping to get to goal and maintain normal blood pressure. ?DIET: Limit salt intake, read nutrition labels to check salt content, limit fried  and high fatty foods  ?Avoid using multisymptom OTC cold preparations that generally contain sudafed which can rise BP. Consult with pharmacist on best cold relief products to use for persons with HTN ?EXERCISE ?Discussed incorporating exercise such as walking - 30 minutes most days of the week and can do in 10 minute intervals    ?- amLODipine (NORVASC) 5 MG tablet; Take 1 tablet (5 mg total) by mouth daily.  Dispense: 90 tablet; Refill: 1 ?- hydrochlorothiazide (HYDRODIURIL) 25 MG tablet; Take 1 tablet (25 mg total) by mouth daily.  Dispense: 90 tablet; Refill: 3 ?- CMP14+EGFR ?- CBC with Differential ? ?2. Mixed hyperlipidemia ? Healthy lifestyle diet of fruits vegetables fish nuts whole grains and low saturated fat . Foods high in cholesterol or liver, fatty meats,cheese, butter avocados, nuts and seeds, chocolate and fried foods. ? ?- Lipid Panel ? ?3. Seasonal allergies ?- fluticasone (FLONASE) 50 MCG/ACT nasal spray; Place 2 sprays into both nostrils daily.  Dispense: 16 g; Refill: 6 ? ?4. Class 2 severe obesity due to excess calories with serious comorbidity in adult, unspecified BMI ([place]) ?Obesity is 30-39 indicating an excess in caloric intake or underlining conditions. This may lead to other co-morbidities. Lifestyle modifications of diet and exercise may reduce obesity.   ?- TSH + free T4 ? ?Patient have been counseled extensively about nutrition and exercise. Other issues discussed during this visit include: low cholesterol diet, weight control and daily exercise, foot care,  annual eye examinations at Ophthalmology, importance of adherence with medications and regular follow-up. We also discussed long term complications of uncontrolled diabetes and hypertension.  ? ?Return in about 6 months (around 05/05/2022) for Bp-fasting labs . ? ?The patient was given clear instructions to go to ER or return to medical center if symptoms don't improve, worsen or new problems develop. The patient verbalized  understanding. The patient was told to call to get lab results if they haven't heard anything in the next week.  ? ?This note has been created with Surveyor, quantity. Any transcriptional errors are unintentional.  ? ?Kerin Perna, NP ?11/06/2021, 9:25 PM  ?

## 2021-12-15 ENCOUNTER — Ambulatory Visit (INDEPENDENT_AMBULATORY_CARE_PROVIDER_SITE_OTHER): Payer: Self-pay | Admitting: *Deleted

## 2021-12-15 NOTE — Telephone Encounter (Signed)
  Chief Complaint: vaginal discharge- request STD check Symptoms: discharge, itching Frequency: 2 days Pertinent Negatives: Patient denies fever, vaginal bleeding, pain with urination, injury to genital area, vaginal foreign body Disposition: '[]'$ ED /'[x]'$ Urgent Care (no appt availability in office) / '[]'$ Appointment(In office/virtual)/ '[]'$  [place] [facility]/ '[]'$ Home Care/ '[]'$ Refused Recommended Disposition /'[]'$ [facility]/ '[]'$  Follow-up with PCP Additional Notes: Patient requesting STD testing- advised UC

## 2021-12-15 NOTE — Telephone Encounter (Signed)
Summary: yeast infection   Pt may have a yeast infection/ thick discharge and vaginal discomfort / please advise      Reason for Disposition  [1] MILD-MODERATE pain AND [2] present > 24 hours (Exception: chronic pain)  Answer Assessment - Initial Assessment Questions 1. SYMPTOM: "What's the main symptom you're concerned about?" (e.g., pain, itching, dryness)     Discharge, discomfort 2. LOCATION: "Where is the   symptoms  located?" (e.g., inside/outside, left/right)     vulvar discomfort 3. ONSET: "When did the  symptoms  start?"     yesterday 4. PAIN: "Is there any pain?" If Yes, ask: "How bad is it?" (Scale: 1-10; mild, moderate, severe)     no 5. ITCHING: "Is there any itching?" If Yes, ask: "How bad is it?" (Scale: 1-10; mild, moderate, severe)     yes 6. CAUSE: "What do you think is causing the discharge?" "Have you had the same problem before? What happened then?"     Yeast possible 7. OTHER SYMPTOMS: "Do you have any other symptoms?" (e.g., fever, itching, vaginal bleeding, pain with urination, injury to genital area, vaginal foreign body)     itching 8. PREGNANCY: "Is there any chance you are pregnant?" "When was your last menstrual period?"  Protocols used: Vaginal Symptoms-A-AH

## 2021-12-26 ENCOUNTER — Encounter (HOSPITAL_COMMUNITY): Payer: Self-pay | Admitting: Emergency Medicine

## 2021-12-26 ENCOUNTER — Ambulatory Visit (INDEPENDENT_AMBULATORY_CARE_PROVIDER_SITE_OTHER): Payer: Self-pay | Admitting: *Deleted

## 2021-12-26 ENCOUNTER — Ambulatory Visit: Payer: Self-pay

## 2021-12-26 ENCOUNTER — Emergency Department (HOSPITAL_COMMUNITY)
Admission: EM | Admit: 2021-12-26 | Discharge: 2021-12-26 | Discharge status: Against Medical Advice | Payer: Medicaid Other | Attending: Emergency Medicine | Admitting: Emergency Medicine

## 2021-12-26 ENCOUNTER — Telehealth (INDEPENDENT_AMBULATORY_CARE_PROVIDER_SITE_OTHER): Payer: Medicaid Other | Admitting: Primary Care

## 2021-12-26 ENCOUNTER — Other Ambulatory Visit: Payer: Self-pay

## 2021-12-26 DIAGNOSIS — J029 Acute pharyngitis, unspecified: Secondary | ICD-10-CM | POA: Diagnosis not present

## 2021-12-26 DIAGNOSIS — J069 Acute upper respiratory infection, unspecified: Secondary | ICD-10-CM | POA: Diagnosis not present

## 2021-12-26 DIAGNOSIS — Z5321 Procedure and treatment not carried out due to patient leaving prior to being seen by health care provider: Secondary | ICD-10-CM | POA: Insufficient documentation

## 2021-12-26 HISTORY — DX: Essential (primary) hypertension: I10

## 2021-12-26 LAB — GROUP A STREP BY PCR: Group A Strep by PCR: NOT DETECTED

## 2021-12-26 MED ORDER — GUAIFENESIN ER 600 MG PO TB12: 600.0000 mg | ORAL_TABLET | Freq: Two times a day (BID) | ORAL | 1 refills | Status: DC

## 2021-12-26 NOTE — Progress Notes (Signed)
  [place]  Virtual Visit   I connected with Ashley Li, on 12/30/2021 at 5:32 PM through an audio and video application and verified that I am speaking with the correct person using two identifiers.   Consent: I discussed the limitations, risks, security and privacy concerns of performing an evaluation and management service by telephone and the availability of in person appointments. I also discussed with the patient that there may be a patient responsible charge related to this service. The patient expressed understanding and agreed to proceed.   Location of Patient: Home  Location of Provider: [facility]   Persons participating in Telemedicine visit: Debby Freiberg,  NP  History of Present Illness: Ms. Ashley Li is a 41 year old female left the ED after waiting 3 1/2 hrs- c/o sore throat and congestion. Strep test was negative .   Past Medical History:  Diagnosis Date   Hypertension    No Known Allergies  Current Outpatient Medications on File Prior to Visit  Medication Sig Dispense Refill   amLODipine (NORVASC) 5 MG tablet Take 1 tablet (5 mg total) by mouth daily. 90 tablet 1   atorvastatin (LIPITOR) 40 MG tablet Take 1 tablet (40 mg total) by mouth daily. 90 tablet 3   etonogestrel (NEXPLANON) 68 MG IMPL implant 1 each by Subdermal route once.     fluticasone (FLONASE) 50 MCG/ACT nasal spray Place 2 sprays into both nostrils daily. 16 g 6   hydrochlorothiazide (HYDRODIURIL) 25 MG tablet Take 1 tablet (25 mg total) by mouth daily. 90 tablet 3   levocetirizine (XYZAL ALLERGY 24HR) 5 MG tablet Take 1 tablet (5 mg total) by mouth every evening. 90 tablet 1   Multiple Vitamin (MULTIVITAMIN) capsule Take 1 capsule by mouth daily.     No current facility-administered medications on file prior to visit.    Observations/Objective: There were no vitals taken for this visit.    Assessment and Plan: Review of Systems  HENT:  Positive for congestion and sore throat.   Respiratory:  Positive for cough.   Neurological:  Positive for headaches.  All other systems reviewed and are negative.   Diagnoses and all orders for this visit:  Upper respiratory infection with cough and congestion -     guaiFENesin (MUCINEX) 600 MG 12 hr tablet; Take 1 tablet (600 mg total) by mouth 2 (two) times daily.    Follow Up Instructions:    I discussed the assessment and treatment plan with the patient. The patient was provided an opportunity to ask questions and all were answered. The patient agreed with the plan and demonstrated an understanding of the instructions.   The patient was advised to call back or seek an in-person evaluation if the symptoms worsen or if the condition fails to improve as anticipated.     I provided 18 minutes total of non-face-to-face time during this encounter including median intraservice time, reviewing previous notes, investigations, ordering medications, medical decision making, coordinating care and patient verbalized understanding at the end of the visit.    This note has been created with Surveyor, quantity. Any transcriptional errors are unintentional.   Kerin Perna, NP 12/30/2021, 5:32 PM

## 2021-12-26 NOTE — Telephone Encounter (Signed)
Message from Marlis Edelson sent at 12/26/2021  9:04 AM EDT  Summary: sore throat   The patient is experiencing throat discomfort and difficulty speaking   The patient is concerned that they may have strep throat   Please contact the patient further when possible   The patient ended the call before additional questions could be asked           Call History   Type Contact Phone/Fax User  12/26/2021 09:04 AM EDT Phone (Incoming) Garnette Gunner C (Self) [phone] Jerilynn Mages) Lenon Curt, Everette A

## 2021-12-26 NOTE — Telephone Encounter (Signed)
3rd attempt to return call without success.  Per policy will forward to [place].

## 2021-12-26 NOTE — Telephone Encounter (Signed)
Left message to call back to discuss sore throat issues with a nurse.

## 2021-12-26 NOTE — ED Triage Notes (Signed)
Pt states she woke up with no voice today and very sore throat. Yesterday it was scratchy but she could talk. Today she can barely speak. No difficulty breathing.

## 2021-12-26 NOTE — Telephone Encounter (Signed)
No answer, voicemail left. 

## 2021-12-26 NOTE — ED Notes (Signed)
PATIENT left with being seen

## 2022-01-24 ENCOUNTER — Ambulatory Visit (INDEPENDENT_AMBULATORY_CARE_PROVIDER_SITE_OTHER): Payer: Medicaid Other | Admitting: Primary Care

## 2022-01-24 ENCOUNTER — Encounter (INDEPENDENT_AMBULATORY_CARE_PROVIDER_SITE_OTHER): Payer: Self-pay

## 2022-02-02 ENCOUNTER — Ambulatory Visit (INDEPENDENT_AMBULATORY_CARE_PROVIDER_SITE_OTHER): Payer: BLUE CROSS/BLUE SHIELD | Admitting: Primary Care

## 2022-02-27 ENCOUNTER — Encounter (INDEPENDENT_AMBULATORY_CARE_PROVIDER_SITE_OTHER): Payer: Self-pay

## 2022-02-27 ENCOUNTER — Ambulatory Visit (INDEPENDENT_AMBULATORY_CARE_PROVIDER_SITE_OTHER): Payer: Medicaid Other | Admitting: Primary Care

## 2022-05-04 ENCOUNTER — Encounter (INDEPENDENT_AMBULATORY_CARE_PROVIDER_SITE_OTHER): Payer: Self-pay

## 2022-05-04 ENCOUNTER — Ambulatory Visit (INDEPENDENT_AMBULATORY_CARE_PROVIDER_SITE_OTHER): Payer: Medicaid Other | Admitting: Primary Care

## 2022-05-22 ENCOUNTER — Ambulatory Visit (INDEPENDENT_AMBULATORY_CARE_PROVIDER_SITE_OTHER): Payer: Self-pay | Admitting: *Deleted

## 2022-05-22 NOTE — Telephone Encounter (Signed)
Contacted pt and scheduled an appt with Sharyn Lull for 05/23/22 at 850am

## 2022-05-22 NOTE — Telephone Encounter (Signed)
  Chief Complaint: headache, located where "knot" is above left eye  Symptoms: headache moderate constant does not interfere with daily routine. Reports "knot" above left eyebrow and pain feels like behind left eye. Throbbing pain. Has taken tylenol / ibuprofen in the past with relief.  Frequency: started last night  Pertinent Negatives: Patient denies chest pain no difficulty breathing no fever, no visual issues. No weakness  N/T to face no slurred speech.  Disposition: '[]'$ ED /'[x]'$ Urgent Care (no appt availability in office) / '[]'$ Appointment(In office/virtual)/ '[]'$  [place] [facility]/ '[]'$ Home Care/ '[]'$ Refused Recommended Disposition /'[]'$ [place] [facility]/ '[]'$  Follow-up with PCP Additional Notes:   Recommended to go to UC or mobile bus for evaluation. Not sure of disposition. Requesting to see PCP. App already scheduled for 06/20/22 but told patient should not wait until then to be assessed. Please advise .    Reason for Disposition  [1] MODERATE headache (e.g., interferes with normal activities) AND [2] present > 24 hours AND [3] unexplained  (Exceptions: analgesics not tried, typical migraine, or headache part of viral illness)  Answer Assessment - Initial Assessment Questions 1. LOCATION: "Where does it hurt?"      Left side above eyebrow "knot" noted and PCP aware 2. ONSET: "When did the headache start?" (Minutes, hours or days)      Last night  3. PATTERN: "Does the pain come and go, or has it been constant since it started?"     Constant now  4. SEVERITY: "How bad is the pain?" and "What does it keep you from doing?"  (e.g., Scale 1-10; mild, moderate, or severe)   - MILD (1-3): doesn't interfere with normal activities    - MODERATE (4-7): interferes with normal activities or awakens from sleep    - SEVERE (8-10): excruciating pain, unable to do any normal activities        Moderate  5. RECURRENT SYMPTOM: "Have you ever had headaches before?" If Yes, ask: "When was the last time?"  and "What happened that time?"      Yes  6. CAUSE: "What do you think is causing the headache?"     Not sure maybe "knot" above left eyebrow 7. MIGRAINE: "Have you been diagnosed with migraine headaches?" If Yes, ask: "Is this headache similar?"      na 8. HEAD INJURY: "Has there been any recent injury to the head?"      na 9. OTHER SYMPTOMS: "Do you have any other symptoms?" (fever, stiff neck, eye pain, sore throat, cold symptoms)     Left eye pain throbbing behind eye . Takes tylenol or ibuprofen at times and does help with pain .  10. PREGNANCY: "Is there any chance you are pregnant?" "When was your last menstrual period?"       na  Protocols used: [facility]

## 2022-05-23 ENCOUNTER — Encounter (INDEPENDENT_AMBULATORY_CARE_PROVIDER_SITE_OTHER): Payer: Self-pay | Admitting: Primary Care

## 2022-05-23 ENCOUNTER — Ambulatory Visit (INDEPENDENT_AMBULATORY_CARE_PROVIDER_SITE_OTHER): Payer: Medicaid Other | Admitting: Primary Care

## 2022-05-23 VITALS — BP 123/82 | HR 84 | Resp 16 | Wt 180.6 lb

## 2022-05-23 DIAGNOSIS — R519 Headache, unspecified: Secondary | ICD-10-CM | POA: Diagnosis not present

## 2022-05-23 NOTE — Patient Instructions (Addendum)
Headaches General Headache Without Cause A headache is pain or discomfort you feel around the head or neck area. There are many causes and types of headaches. In some cases, the cause may not be found. Follow these instructions at home: Watch your condition for any changes. Let your doctor know about them. Take these steps to help with your condition: Managing pain     Take over-the-counter and prescription medicines only as told by your doctor. This includes medicines for pain that are taken by mouth or put on the skin. Lie down in a dark, quiet room when you have a headache. If told, put ice on your head and neck area: Put ice in a plastic bag. Place a towel between your skin and the bag. Leave the ice on for 20 minutes, 2-3 times per day. Take off the ice if your skin turns bright red. This is very important. If you cannot feel pain, heat, or cold, you have a greater risk of damage to the area. If told, put heat on the affected area. Use the heat source that your doctor recommends, such as a moist heat pack or a heating pad. Place a towel between your skin and the heat source. Leave the heat on for 20-30 minutes. Take off the heat if your skin turns bright red. This is very important. If you cannot feel pain, heat, or cold, you have a greater risk of getting burned. Keep lights dim if bright lights bother you or make your headaches worse. Eating and drinking Eat meals on a regular schedule. If you drink alcohol: Limit how much you have to: 0-1 drink a day for women who are not pregnant. 0-2 drinks a day for men. Know how much alcohol is in a drink. In the [place] one drink equals one 12 oz bottle of beer (355 mL), one 5 oz glass of wine (148 mL), or one 1 oz glass of hard liquor (44 mL). Stop drinking caffeine, or drink less caffeine. General instructions  Keep a journal to find out if certain things bring on headaches. For example, write down: What you eat and drink. How much  sleep you get. Any change to your diet or medicines. Get a massage or try other ways to relax. Limit stress. Sit up straight. Do not tighten (tense) your muscles. Do not smoke or use any products that contain nicotine or tobacco. If you need help quitting, ask your doctor. Exercise regularly as told by your doctor. Get enough sleep. This often means 7-9 hours of sleep each night. Keep all follow-up visits. This is important. Contact a doctor if: Medicine does not help your symptoms. You have a headache that feels different than the other headaches. You feel like you may vomit (nauseous) or you vomit. You have a fever. Get help right away if: Your headache: Gets very bad quickly. Gets worse after a lot of physical activity. You have any of these symptoms: You continue to vomit. A stiff neck. Trouble seeing. Your eye or ear hurts. Trouble speaking. Weak muscles or you lose muscle control. You lose your balance or have trouble walking. You feel like you will pass out (faint) or you pass out. You are mixed up (confused). You have a seizure. These symptoms may be an emergency. Get help right away. Call your local emergency services (911 in the [place] Do not wait to see if the symptoms will go away. Do not drive yourself to the hospital. Summary A headache is pain or discomfort  that is felt around the head or neck area. There are many causes and types of headaches. In some cases, the cause may not be found. Keep a journal to help find out what causes your headaches. Watch your condition for any changes. Let your doctor know about them. Contact a doctor if you have a headache that is different from usual, or if medicine does not help your headache. Get help right away if your headache gets very bad, you throw up, you have trouble seeing, you lose your balance, or you have a seizure. This information is not intended to replace advice given to you by your health care provider. Make sure  you discuss any questions you have with your health care provider. Document Revised: 11/30/2020 Document Reviewed: 11/30/2020 Elsevier Patient Education  [place]. Obesity, Adult Obesity is having too much body fat. Being obese means that your weight is more than what is healthy for you.  BMI (body mass index) is a number that explains how much body fat you have. If you have a BMI of 30 or more, you are obese. Obesity can cause serious health problems, such as: Stroke. Coronary artery disease (CAD). Type 2 diabetes. Some types of cancer. High blood pressure (hypertension). High cholesterol. Gallbladder stones. Obesity can also contribute to: Osteoarthritis. Sleep apnea. Infertility problems. What are the causes? Eating meals each day that are high in calories, sugar, and fat. Drinking a lot of drinks that have sugar in them. Being born with genes that may make you more likely to become obese. Having a medical condition that causes obesity. Taking certain medicines. Sitting a lot (having a sedentary lifestyle). Not getting enough sleep. What increases the risk? Having a family history of obesity. Living in an area with limited access to: [place], recreation centers, or sidewalks. Healthy food choices, such as grocery stores and farmers' markets. What are the signs or symptoms? The main sign is having too much body fat. How is this treated? Treatment for this condition often includes changing your lifestyle. Treatment may include: Changing your diet. This may include making a healthy meal plan. Exercise. This may include activity that causes your heart to beat faster (aerobic exercise) and strength training. Work with your doctor to design a program that works for you. Medicine to help you lose weight. This may be used if you are not able to lose one pound a week after 6 weeks of healthy eating and more exercise. Treating conditions that cause the obesity. Surgery.  Options may include gastric banding and gastric bypass. This may be done if: Other treatments have not helped to improve your condition. You have a BMI of 40 or higher. You have life-threatening health problems related to obesity. Follow these instructions at home: Eating and drinking  Follow advice from your doctor about what to eat and drink. Your doctor may tell you to: Limit fast food, sweets, and processed snack foods. Choose low-fat options. For example, choose low-fat milk instead of whole milk. Eat five or more servings of fruits or vegetables each day. Eat at home more often. This gives you more control over what you eat. Choose healthy foods when you eat out. Learn to read food labels. This will help you learn how much food is in one serving. Keep low-fat snacks available. Avoid drinks that have a lot of sugar in them. These include soda, fruit juice, iced tea with sugar, and flavored milk. Drink enough water to keep your pee (urine) pale yellow. Do  not go on fad diets. Physical activity Exercise often, as told by your doctor. Most adults should get up to 150 minutes of moderate-intensity exercise every week.Ask your doctor: What types of exercise are safe for you. How often you should exercise. Warm up and stretch before being active. Do slow stretching after being active (cool down). Rest between times of being active. Lifestyle Work with your doctor and a food expert (dietitian) to set a weight-loss goal that is best for you. Limit your screen time. Find ways to reward yourself that do not involve food. Do not drink alcohol if: Your doctor tells you not to drink. You are pregnant, may be pregnant, or are planning to become pregnant. If you drink alcohol: Limit how much you have to: 0-1 drink a day for women. 0-2 drinks a day for men. Know how much alcohol is in your drink. In the [place] one drink equals one 12 oz bottle of beer (355 mL), one 5 oz glass of wine (148  mL), or one 1 oz glass of hard liquor (44 mL). General instructions Keep a weight-loss journal. This can help you keep track of: The food that you eat. How much exercise you get. Take over-the-counter and prescription medicines only as told by your doctor. Take vitamins and supplements only as told by your doctor. Think about joining a support group. Pay attention to your mental health as obesity can lead to depression or self esteem issues. Keep all follow-up visits. Contact a doctor if: You cannot meet your weight-loss goal after you have changed your diet and lifestyle for 6 weeks. You are having trouble breathing. Summary Obesity is having too much body fat. Being obese means that your weight is more than what is healthy for you. Work with your doctor to set a weight-loss goal. Get regular exercise as told by your doctor. This information is not intended to replace advice given to you by your health care provider. Make sure you discuss any questions you have with your health care provider. Document Revised: 02/07/2021 Document Reviewed: 02/07/2021 Elsevier Patient Education  [place].

## 2022-05-23 NOTE — Progress Notes (Signed)
[facility]  Ashley Li, is a 41 y.o. female  [id number]  [id number]  DOB - 1980/11/02  Chief Complaint  Patient presents with   Headache    Started a couple days ago  Took tylenol        Subjective:   Ashley Li is a 41 y.o. obese acute female here today for a acute visit.  Patient has had a nodule on her forehead left side for over a year.  Yesterday her head started pulsating around and not and causing a headache.  She took some time normal and found relief.  She is voicing concern is about what this nodule will not go away and could this be related to her headaches.  She denies any trauma or falls.  Patient has No visual changes, No chest pain, No abdominal pain - No Nausea, No new weakness tingling or numbness, No Cough - shortness of breath  No problems updated.  No Known Allergies  Past Medical History:  Diagnosis Date   Hypertension     Current Outpatient Medications on File Prior to Visit  Medication Sig Dispense Refill   amLODipine (NORVASC) 5 MG tablet Take 1 tablet (5 mg total) by mouth daily. 90 tablet 1   atorvastatin (LIPITOR) 40 MG tablet Take 1 tablet (40 mg total) by mouth daily. 90 tablet 3   etonogestrel (NEXPLANON) 68 MG IMPL implant 1 each by Subdermal route once.     hydrochlorothiazide (HYDRODIURIL) 25 MG tablet Take 1 tablet (25 mg total) by mouth daily. 90 tablet 3   levocetirizine (XYZAL ALLERGY 24HR) 5 MG tablet Take 1 tablet (5 mg total) by mouth every evening. 90 tablet 1   Multiple Vitamin (MULTIVITAMIN) capsule Take 1 capsule by mouth daily.     No current facility-administered medications on file prior to visit.   Comprehensive ROS Pertinent positive and negative noted in HPI   Objective:   Vitals:   05/23/22 0851  BP: 123/82  Pulse: 84  Resp: 16  SpO2: 98%  Weight: 180 lb 9.6 oz (81.9 kg)    Exam General appearance : Awake, alert, not in any distress. Speech Clear. Not toxic looking HEENT:  Atraumatic and Normocephalic, dime size nodule left forehead. pupils equally reactive to light and accomodation Neck: Supple, no JVD. No cervical lymphadenopathy.  Chest: Good air entry bilaterally, no added sounds  CVS: S1 S2 regular, no murmurs.  Abdomen: Bowel sounds present, Non tender and not distended with no gaurding, rigidity or rebound. Extremities: B/L Lower Ext shows no edema, both legs are warm to touch Neurology: Awake alert, and oriented X 3, CN II-XII intact, Non focal Skin: No Rash  Data Review Assessment & Plan  [name] was seen today for headache.  Diagnoses and all orders for this visit:  Acute intractable headache, unspecified headache type Denies signs throbbing pain on any area of the head.  She denies nausea, sensitive to light sound or smell.  Unaware of any triggers can be caused by drinking alcohol smoking or certain medications, eating and drinking certain products  This condition may be triggered or caused by: Caffeine, aged cheese, and chocolate.  Will have patient to keep a diary of any probable causes and if tylenol or ibuprofen becomes ineffective .   Patient have been counseled extensively about nutrition and exercise. Other issues discussed during this visit include: low cholesterol diet, weight control and daily exercise, foot care, annual eye examinations at Ophthalmology, importance of adherence with medications and regular follow-up. We  also discussed long term complications of uncontrolled diabetes and hypertension.   The patient was given clear instructions to go to ER or return to medical center if symptoms don't improve, worsen or new problems develop. The patient verbalized understanding. The patient was told to call to get lab results if they haven't heard anything in the next week.   This note has been created with Surveyor, quantity. Any transcriptional errors are unintentional.   Kerin Perna,  NP 05/27/2022, 10:33 PM

## 2022-06-20 ENCOUNTER — Ambulatory Visit (INDEPENDENT_AMBULATORY_CARE_PROVIDER_SITE_OTHER): Payer: BLUE CROSS/BLUE SHIELD | Admitting: Primary Care

## 2022-07-04 ENCOUNTER — Encounter (INDEPENDENT_AMBULATORY_CARE_PROVIDER_SITE_OTHER): Payer: Self-pay

## 2022-07-04 ENCOUNTER — Ambulatory Visit (INDEPENDENT_AMBULATORY_CARE_PROVIDER_SITE_OTHER): Payer: BLUE CROSS/BLUE SHIELD | Admitting: Primary Care

## 2022-07-05 ENCOUNTER — Encounter (INDEPENDENT_AMBULATORY_CARE_PROVIDER_SITE_OTHER): Payer: Self-pay | Admitting: Primary Care

## 2022-07-05 ENCOUNTER — Ambulatory Visit (INDEPENDENT_AMBULATORY_CARE_PROVIDER_SITE_OTHER): Payer: Medicaid Other | Admitting: Primary Care

## 2022-07-05 VITALS — BP 126/86 | HR 79 | Ht 62.0 in | Wt 181.2 lb

## 2022-07-05 DIAGNOSIS — G4700 Insomnia, unspecified: Secondary | ICD-10-CM | POA: Diagnosis not present

## 2022-07-05 DIAGNOSIS — E782 Mixed hyperlipidemia: Secondary | ICD-10-CM

## 2022-07-05 DIAGNOSIS — D170 Benign lipomatous neoplasm of skin and subcutaneous tissue of head, face and neck: Secondary | ICD-10-CM

## 2022-07-05 DIAGNOSIS — N921 Excessive and frequent menstruation with irregular cycle: Secondary | ICD-10-CM

## 2022-07-05 DIAGNOSIS — I10 Essential (primary) hypertension: Secondary | ICD-10-CM | POA: Diagnosis not present

## 2022-07-05 MED ORDER — TRAZODONE HCL 50 MG PO TABS: 25.0000 mg | ORAL_TABLET | Freq: Every evening | ORAL | 1 refills | Status: DC | PRN

## 2022-07-05 NOTE — Progress Notes (Signed)
Ashley Li, is a 41 y.o. female  [id number]  [id number]  DOB - [birth year]  Chief Complaint  Patient presents with   Hypertension       Subjective:   Ashley Li is a 41 y.o. obese female here today for Bp follow up visit. Patient has No headache, No chest pain, No abdominal pain - No Nausea, No new weakness tingling or numbness, No Cough - shortness of breath. Only voiced one concern insomnia. She also has a lipoma on her forehead that intermittently hurts. Today no pain.  No problems updated.  No Known Allergies  Past Medical History:  Diagnosis Date   Hypertension     Current Outpatient Medications on File Prior to Visit  Medication Sig Dispense Refill   amLODipine (NORVASC) 5 MG tablet Take 1 tablet (5 mg total) by mouth daily. 90 tablet 1   etonogestrel (NEXPLANON) 68 MG IMPL implant 1 each by Subdermal route once.     hydrochlorothiazide (HYDRODIURIL) 25 MG tablet Take 1 tablet (25 mg total) by mouth daily. 90 tablet 3   levocetirizine (XYZAL ALLERGY 24HR) 5 MG tablet Take 1 tablet (5 mg total) by mouth every evening. 90 tablet 1   Multiple Vitamin (MULTIVITAMIN) capsule Take 1 capsule by mouth daily.     No current facility-administered medications on file prior to visit.    Objective:   Vitals:   07/05/22 0835  BP: 126/86  Pulse: 79  SpO2: 98%  Weight: 181 lb 3.2 oz (82.2 kg)  Height: _0  (1.575 m)    Exam General appearance : Awake, alert, not in any distress. Speech Clear. Not toxic looking HEENT: Atraumatic and Normocephalic, pupils equally reactive to light and accomodation Neck: Supple, no JVD. No cervical lymphadenopathy.  Chest: Good air entry bilaterally, no added sounds  CVS: S1 S2 regular, no murmurs.  Abdomen: Bowel sounds present, Non tender and not distended with no gaurding, rigidity or rebound. Extremities: B/L Lower Ext shows no edema, both legs are warm to touch Neurology: Awake alert,  and oriented X 3, CN II-XII intact, Non focal Skin: No Rash  Data Review No results found for: "HGBA1C"  Assessment & Plan  Ashley Li was seen today for hypertension.  Diagnoses and all orders for this visit:  Essential hypertension BP goal -met < 130/80 Explained that having normal blood pressure is the goal and medications are helping to get to goal and maintain normal blood pressure. DIET: Limit salt intake, read nutrition labels to check salt content, limit fried and high fatty foods  Avoid using multisymptom OTC cold preparations that generally contain sudafed which can rise BP. Consult with pharmacist on best cold relief products to use for persons with HTN EXERCISE Discussed incorporating exercise such as walking - 30 minutes most days of the week and can do in 10 minute intervals    -     CMP14+EGFR  Mixed hyperlipidemia  Healthy lifestyle diet of fruits vegetables fish nuts whole grains and low saturated fat . Foods high in cholesterol or liver, fatty meats,cheese, butter avocados, nuts and seeds, chocolate and fried foods. -     Lipid Panel  Class 2 severe obesity due to excess calories with serious comorbidity in adult, unspecified BMI ([place]) Obesity is 30-39 indicating an excess in caloric intake or underlining conditions. This may lead to other co-morbidities. Educated on lifestyle modifications of diet and exercise which may reduce obesity.   -     Lipid Panel  Lipoma of  face Left side forehead/frontal-no pain, no change in size  Insomnia, unspecified type -     traZODone (DESYREL) 50 MG tablet; Take 0.5-1 tablets (25-50 mg total) by mouth at bedtime as needed for sleep.  Menorrhagia with irregular cycle  heavy  with irregular menstrual cycles. .  -     CBC with Differential  Patient have been counseled extensively about nutrition and exercise. Other issues discussed during this visit include: low cholesterol diet, weight control and daily exercise, foot care, annual  eye examinations at Ophthalmology, importance of adherence with medications and regular follow-up. We also discussed long term complications of uncontrolled diabetes and hypertension.   Return in about 2 months (around 09/05/2022) for insomnia.  The patient was given clear instructions to go to ER or return to medical center if symptoms don't improve, worsen or new problems develop. The patient verbalized understanding. The patient was told to call to get lab results if they haven't heard anything in the next week.   This note has been created with Surveyor, quantity. Any transcriptional errors are unintentional.   Kerin Perna, NP 07/10/2022, 11:06 PM

## 2022-07-05 NOTE — Patient Instructions (Signed)
Calorie Counting for Weight Loss Calories are units of energy. Your body needs a certain number of calories from food to keep going throughout the day. When you eat or drink more calories than your body needs, your body stores the extra calories mostly as fat. When you eat or drink fewer calories than your body needs, your body burns fat to get the energy it needs. Calorie counting means keeping track of how many calories you eat and drink each day. Calorie counting can be helpful if you need to lose weight. If you eat fewer calories than your body needs, you should lose weight. Ask your health care provider what a healthy weight is for you. For calorie counting to work, you will need to eat the right number of calories each day to lose a healthy amount of weight per week. A dietitian can help you figure out how many calories you need in a day and will suggest ways to reach your calorie goal. A healthy amount of weight to lose each week is usually 1-2 lb (0.5-0.9 kg). This usually means that your daily calorie intake should be reduced by 500-750 calories. Eating 1,200-1,500 calories a day can help most women lose weight. Eating 1,500-1,800 calories a day can help most men lose weight. What do I need to know about calorie counting? Work with your health care provider or dietitian to determine how many calories you should get each day. To meet your daily calorie goal, you will need to: Find out how many calories are in each food that you would like to eat. Try to do this before you eat. Decide how much of the food you plan to eat. Keep a food log. Do this by writing down what you ate and how many calories it had. To successfully lose weight, it is important to balance calorie counting with a healthy lifestyle that includes regular activity. Where do I find calorie information?  The number of calories in a food can be found on a Nutrition Facts label. If a food does not have a Nutrition Facts label, try  to look up the calories online or ask your dietitian for help. Remember that calories are listed per serving. If you choose to have more than one serving of a food, you will have to multiply the calories per serving by the number of servings you plan to eat. For example, the label on a package of bread might say that a serving size is 1 slice and that there are 90 calories in a serving. If you eat 1 slice, you will have eaten 90 calories. If you eat 2 slices, you will have eaten 180 calories. How do I keep a food log? After each time that you eat, record the following in your food log as soon as possible: What you ate. Be sure to include toppings, sauces, and other extras on the food. How much you ate. This can be measured in cups, ounces, or number of items. How many calories were in each food and drink. The total number of calories in the food you ate. Keep your food log near you, such as in a pocket-sized notebook or on an app or website on your mobile phone. Some programs will calculate calories for you and show you how many calories you have left to meet your daily goal. What are some portion-control tips? Know how many calories are in a serving. This will help you know how many servings you can have of a certain   food. Use a measuring cup to measure serving sizes. You could also try weighing out portions on a kitchen scale. With time, you will be able to estimate serving sizes for some foods. Take time to put servings of different foods on your favorite plates or in your favorite bowls and cups so you know what a serving looks like. Try not to eat straight from a food's packaging, such as from a bag or box. Eating straight from the package makes it hard to see how much you are eating and can lead to overeating. Put the amount you would like to eat in a cup or on a plate to make sure you are eating the right portion. Use smaller plates, glasses, and bowls for smaller portions and to prevent  overeating. Try not to multitask. For example, avoid watching TV or using your computer while eating. If it is time to eat, sit down at a table and enjoy your food. This will help you recognize when you are full. It will also help you be more mindful of what and how much you are eating. What are tips for following this plan? Reading food labels Check the calorie count compared with the serving size. The serving size may be smaller than what you are used to eating. Check the source of the calories. Try to choose foods that are high in protein, fiber, and vitamins, and low in saturated fat, trans fat, and sodium. Shopping Read nutrition labels while you shop. This will help you make healthy decisions about which foods to buy. Pay attention to nutrition labels for low-fat or fat-free foods. These foods sometimes have the same number of calories or more calories than the full-fat versions. They also often have added sugar, starch, or salt to make up for flavor that was removed with the fat. Make a grocery list of lower-calorie foods and stick to it. Cooking Try to cook your favorite foods in a healthier way. For example, try baking instead of frying. Use low-fat dairy products. Meal planning Use more fruits and vegetables. One-half of your plate should be fruits and vegetables. Include lean proteins, such as chicken, turkey, and fish. Lifestyle Each week, aim to do one of the following: 150 minutes of moderate exercise, such as walking. 75 minutes of vigorous exercise, such as running. General information Know how many calories are in the foods you eat most often. This will help you calculate calorie counts faster. Find a way of tracking calories that works for you. Get creative. Try different apps or programs if writing down calories does not work for you. What foods should I eat?  Eat nutritious foods. It is better to have a nutritious, high-calorie food, such as an avocado, than a food with  few nutrients, such as a bag of potato chips. Use your calories on foods and drinks that will fill you up and will not leave you hungry soon after eating. Examples of foods that fill you up are nuts and nut butters, vegetables, lean proteins, and high-fiber foods such as whole grains. High-fiber foods are foods with more than 5 g of fiber per serving. Pay attention to calories in drinks. Low-calorie drinks include water and unsweetened drinks. The items listed above may not be a complete list of foods and beverages you can eat. Contact a dietitian for more information. What foods should I limit? Limit foods or drinks that are not good sources of vitamins, minerals, or protein or that are high in unhealthy fats. These   include: Candy. Other sweets. Sodas, specialty coffee drinks, alcohol, and juice. The items listed above may not be a complete list of foods and beverages you should avoid. Contact a dietitian for more information. How do I count calories when eating out? Pay attention to portions. Often, portions are much larger when eating out. Try these tips to keep portions smaller: Consider sharing a meal instead of getting your own. If you get your own meal, eat only half of it. Before you start eating, ask for a container and put half of your meal into it. When available, consider ordering smaller portions from the menu instead of full portions. Pay attention to your food and drink choices. Knowing the way food is cooked and what is included with the meal can help you eat fewer calories. If calories are listed on the menu, choose the lower-calorie options. Choose dishes that include vegetables, fruits, whole grains, low-fat dairy products, and lean proteins. Choose items that are boiled, broiled, grilled, or steamed. Avoid items that are buttered, battered, fried, or served with cream sauce. Items labeled as crispy are usually fried, unless stated otherwise. Choose water, low-fat milk,  unsweetened iced tea, or other drinks without added sugar. If you want an alcoholic beverage, choose a lower-calorie option, such as a glass of wine or light beer. Ask for dressings, sauces, and syrups on the side. These are usually high in calories, so you should limit the amount you eat. If you want a salad, choose a garden salad and ask for grilled meats. Avoid extra toppings such as bacon, cheese, or fried items. Ask for the dressing on the side, or ask for olive oil and vinegar or lemon to use as dressing. Estimate how many servings of a food you are given. Knowing serving sizes will help you be aware of how much food you are eating at restaurants. Where to find more information Centers for Disease Control and Prevention: http://www.wolf.info/ [place] Department of Agriculture: http://www.wilson-mendoza.org/ Summary Calorie counting means keeping track of how many calories you eat and drink each day. If you eat fewer calories than your body needs, you should lose weight. A healthy amount of weight to lose per week is usually 1-2 lb (0.5-0.9 kg). This usually means reducing your daily calorie intake by 500-750 calories. The number of calories in a food can be found on a Nutrition Facts label. If a food does not have a Nutrition Facts label, try to look up the calories online or ask your dietitian for help. Use smaller plates, glasses, and bowls for smaller portions and to prevent overeating. Use your calories on foods and drinks that will fill you up and not leave you hungry shortly after a meal. This information is not intended to replace advice given to you by your health care provider. Make sure you discuss any questions you have with your health care provider. Document Revised: 08/13/2019 Document Reviewed: 08/13/2019 Elsevier Patient Education  [place]. Insomnia Insomnia is a sleep disorder that makes it difficult to fall asleep or stay asleep. Insomnia can cause fatigue, low energy, difficulty concentrating,  mood swings, and poor performance at work or school. There are three different ways to classify insomnia: Difficulty falling asleep. Difficulty staying asleep. Waking up too early in the morning. Any type of insomnia can be long-term (chronic) or short-term (acute). Both are common. Short-term insomnia usually lasts for 3 months or less. Chronic insomnia occurs at least three times a week for longer than 3 months. What are  the causes? Insomnia may be caused by another condition, situation, or substance, such as: Having certain mental health conditions, such as anxiety and depression. Using caffeine, alcohol, tobacco, or drugs. Having gastrointestinal conditions, such as gastroesophageal reflux disease (GERD). Having certain medical conditions. These include: Asthma. Alzheimer's disease. Stroke. Chronic pain. An overactive thyroid gland (hyperthyroidism). Other sleep disorders, such as restless legs syndrome and sleep apnea. Menopause. Sometimes, the cause of insomnia may not be known. What increases the risk? Risk factors for insomnia include: Gender. Females are affected more often than males. Age. Insomnia is more common as people get older. Stress and certain medical and mental health conditions. Lack of exercise. Having an irregular work schedule. This may include working night shifts and traveling between different time zones. What are the signs or symptoms? If you have insomnia, the main symptom is having trouble falling asleep or having trouble staying asleep. This may lead to other symptoms, such as: Feeling tired or having low energy. Feeling nervous about going to sleep. Not feeling rested in the morning. Having trouble concentrating. Feeling irritable, anxious, or depressed. How is this diagnosed? This condition may be diagnosed based on: Your symptoms and medical history. Your health care provider may ask about: Your sleep habits. Any medical conditions you  have. Your mental health. A physical exam. How is this treated? Treatment for insomnia depends on the cause. Treatment may focus on treating an underlying condition that is causing the insomnia. Treatment may also include: Medicines to help you sleep. Counseling or therapy. Lifestyle adjustments to help you sleep better. Follow these instructions at home: Eating and drinking  Limit or avoid alcohol, caffeinated beverages, and products that contain nicotine and tobacco, especially close to bedtime. These can disrupt your sleep. Do not eat a large meal or eat spicy foods right before bedtime. This can lead to digestive discomfort that can make it hard for you to sleep. Sleep habits  Keep a sleep diary to help you and your health care provider figure out what could be causing your insomnia. Write down: When you sleep. When you wake up during the night. How well you sleep and how rested you feel the next day. Any side effects of medicines you are taking. What you eat and drink. Make your bedroom a dark, comfortable place where it is easy to fall asleep. Put up shades or blackout curtains to block light from outside. Use a white noise machine to block noise. Keep the temperature cool. Limit screen use before bedtime. This includes: Not watching TV. Not using your smartphone, tablet, or computer. Stick to a routine that includes going to bed and waking up at the same times every day and night. This can help you fall asleep faster. Consider making a quiet activity, such as reading, part of your nighttime routine. Try to avoid taking naps during the day so that you sleep better at night. Get out of bed if you are still awake after 15 minutes of trying to sleep. Keep the lights down, but try reading or doing a quiet activity. When you feel sleepy, go back to bed. General instructions Take over-the-counter and prescription medicines only as told by your health care provider. Exercise regularly  as told by your health care provider. However, avoid exercising in the hours right before bedtime. Use relaxation techniques to manage stress. Ask your health care provider to suggest some techniques that may work well for you. These may include: Breathing exercises. Routines to release muscle tension. Visualizing peaceful  scenes. Make sure that you drive carefully. Do not drive if you feel very sleepy. Keep all follow-up visits. This is important. Contact a health care provider if: You are tired throughout the day. You have trouble in your daily routine due to sleepiness. You continue to have sleep problems, or your sleep problems get worse. Get help right away if: You have thoughts about hurting yourself or someone else. Get help right away if you feel like you may hurt yourself or others, or have thoughts about taking your own life. Go to your nearest emergency room or: Call 911. Call the [facility] at [phone] or 988. This is open 24 hours a day. Text the Crisis Text Line at 425-001-8801. Summary Insomnia is a sleep disorder that makes it difficult to fall asleep or stay asleep. Insomnia can be long-term (chronic) or short-term (acute). Treatment for insomnia depends on the cause. Treatment may focus on treating an underlying condition that is causing the insomnia. Keep a sleep diary to help you and your health care provider figure out what could be causing your insomnia. This information is not intended to replace advice given to you by your health care provider. Make sure you discuss any questions you have with your health care provider. Document Revised: 06/12/2021 Document Reviewed: 06/12/2021 Elsevier Patient Education  [place].

## 2022-07-06 LAB — CMP14+EGFR
ALT: 16 IU/L (ref 0–32)
AST: 16 IU/L (ref 0–40)
Albumin/Globulin Ratio: 1.9 (ref 1.2–2.2)
Albumin: 4.3 g/dL (ref 3.9–4.9)
Alkaline Phosphatase: 45 IU/L (ref 44–121)
BUN/Creatinine Ratio: 16 (ref 9–23)
BUN: 12 mg/dL (ref 6–20)
Bilirubin Total: 0.4 mg/dL (ref 0.0–1.2)
CO2: 25 mmol/L (ref 20–29)
Calcium: 9.6 mg/dL (ref 8.7–10.2)
Chloride: 105 mmol/L (ref 96–106)
Creatinine, Ser: 0.76 mg/dL (ref 0.57–1.00)
Globulin, Total: 2.3 g/dL (ref 1.5–4.5)
Glucose: 110 mg/dL — ABNORMAL HIGH (ref 70–99)
Potassium: 3.6 mmol/L (ref 3.5–5.2)
Sodium: 144 mmol/L (ref 134–144)
Total Protein: 6.6 g/dL (ref 6.0–8.5)
eGFR: 102 mL/min/{1.73_m2} (ref >59)

## 2022-07-06 LAB — CBC WITH DIFFERENTIAL/PLATELET
Basophils Absolute: 0 10*3/uL (ref 0.0–0.2)
Basos: 0 %
EOS (ABSOLUTE): 0.2 10*3/uL (ref 0.0–0.4)
Eos: 4 %
Hematocrit: 42.6 % (ref 34.0–46.6)
Hemoglobin: 14.4 g/dL (ref 11.1–15.9)
Immature Grans (Abs): 0 10*3/uL (ref 0.0–0.1)
Immature Granulocytes: 0 %
Lymphocytes Absolute: 2.1 10*3/uL (ref 0.7–3.1)
Lymphs: 48 %
MCH: 29.5 pg (ref 26.6–33.0)
MCHC: 33.8 g/dL (ref 31.5–35.7)
MCV: 87 fL (ref 79–97)
Monocytes Absolute: 0.4 10*3/uL (ref 0.1–0.9)
Monocytes: 9 %
Neutrophils Absolute: 1.7 10*3/uL (ref 1.4–7.0)
Neutrophils: 39 %
Platelets: 364 10*3/uL (ref 150–450)
RBC: 4.88 x10E6/uL (ref 3.77–5.28)
RDW: 12.8 % (ref 11.7–15.4)
WBC: 4.5 10*3/uL (ref 3.4–10.8)

## 2022-07-06 LAB — LIPID PANEL
Chol/HDL Ratio: 3.8 ratio (ref 0.0–4.4)
Cholesterol, Total: 165 mg/dL (ref 100–199)
HDL: 44 mg/dL (ref >39)
LDL Chol Calc (NIH): 106 mg/dL — ABNORMAL HIGH (ref 0–99)
Triglycerides: 78 mg/dL (ref 0–149)
VLDL Cholesterol Cal: 15 mg/dL (ref 5–40)

## 2022-07-10 ENCOUNTER — Other Ambulatory Visit (INDEPENDENT_AMBULATORY_CARE_PROVIDER_SITE_OTHER): Payer: Self-pay | Admitting: Primary Care

## 2022-07-10 DIAGNOSIS — E782 Mixed hyperlipidemia: Secondary | ICD-10-CM

## 2022-07-10 MED ORDER — ATORVASTATIN CALCIUM 20 MG PO TABS: 20.0000 mg | ORAL_TABLET | Freq: Every day | ORAL | 1 refills | Status: DC

## 2022-08-13 ENCOUNTER — Ambulatory Visit (INDEPENDENT_AMBULATORY_CARE_PROVIDER_SITE_OTHER): Payer: Medicaid Other

## 2022-08-13 ENCOUNTER — Other Ambulatory Visit (HOSPITAL_COMMUNITY)
Admission: RE | Admit: 2022-08-13 | Discharge: 2022-08-13 | Disposition: A | Discharge status: Home / Self Care | Payer: Medicaid Other | Source: Ambulatory Visit | Attending: Family Medicine | Admitting: Family Medicine

## 2022-08-13 DIAGNOSIS — Z113 Encounter for screening for infections with a predominantly sexual mode of transmission: Secondary | ICD-10-CM | POA: Insufficient documentation

## 2022-08-14 ENCOUNTER — Other Ambulatory Visit (INDEPENDENT_AMBULATORY_CARE_PROVIDER_SITE_OTHER): Payer: Self-pay | Admitting: Primary Care

## 2022-08-14 LAB — CERVICOVAGINAL ANCILLARY ONLY
Bacterial Vaginitis (gardnerella): POSITIVE — AB
Candida Glabrata: NEGATIVE
Candida Vaginitis: NEGATIVE
Chlamydia: NEGATIVE
Comment: NEGATIVE
Comment: NEGATIVE
Comment: NEGATIVE
Comment: NEGATIVE
Comment: NEGATIVE
Comment: NORMAL
Neisseria Gonorrhea: NEGATIVE
Trichomonas: NEGATIVE

## 2022-08-14 MED ORDER — METRONIDAZOLE 500 MG PO TABS: 500.0000 mg | ORAL_TABLET | Freq: Two times a day (BID) | ORAL | 0 refills | Status: DC

## 2022-09-05 ENCOUNTER — Encounter (INDEPENDENT_AMBULATORY_CARE_PROVIDER_SITE_OTHER): Payer: Self-pay | Admitting: Primary Care

## 2022-09-05 ENCOUNTER — Ambulatory Visit (INDEPENDENT_AMBULATORY_CARE_PROVIDER_SITE_OTHER): Payer: Commercial Managed Care - PPO | Admitting: Primary Care

## 2022-09-05 VITALS — BP 117/78 | HR 73 | Resp 16 | Ht 62.5 in | Wt 182.6 lb

## 2022-09-05 DIAGNOSIS — G4700 Insomnia, unspecified: Secondary | ICD-10-CM

## 2022-09-05 DIAGNOSIS — E785 Hyperlipidemia, unspecified: Secondary | ICD-10-CM

## 2022-09-05 DIAGNOSIS — F4329 Adjustment disorder with other symptoms: Secondary | ICD-10-CM

## 2022-09-05 DIAGNOSIS — D170 Benign lipomatous neoplasm of skin and subcutaneous tissue of head, face and neck: Secondary | ICD-10-CM

## 2022-09-05 MED ORDER — TRAZODONE HCL 50 MG PO TABS: ORAL_TABLET | ORAL | 2 refills | Status: DC

## 2022-09-05 NOTE — Patient Instructions (Signed)
Dyslipidemia Dyslipidemia is an imbalance of waxy, fat-like substances (lipids) in the blood. The body needs lipids in small amounts. Dyslipidemia often involves a high level of cholesterol or triglycerides, which are types of lipids. Common forms of dyslipidemia include: High levels of LDL cholesterol. LDL is the type of cholesterol that causes fatty deposits (plaques) to build up in the blood vessels that carry blood away from the heart (arteries). Low levels of HDL cholesterol. HDL cholesterol is the type of cholesterol that protects against heart disease. High levels of HDL remove the LDL buildup from arteries. High levels of triglycerides. Triglycerides are a fatty substance in the blood that is linked to a buildup of plaques in the arteries. What are the causes? There are two main types of dyslipidemia: primary and secondary. Primary dyslipidemia is caused by changes (mutations) in genes that are passed down through families (inherited). These mutations cause several types of dyslipidemia. Secondary dyslipidemia may be caused by various risk factors that can lead to the disease, such as lifestyle choices and certain medical conditions. What increases the risk? You are more likely to develop this condition if you are an older man or if you are a woman who has gone through menopause. Other risk factors include: Having a family history of dyslipidemia. Taking certain medicines, including birth control pills, steroids, some diuretics, and beta-blockers. Eating a diet high in saturated fat. Smoking cigarettes or excessive alcohol intake. Having certain medical conditions such as diabetes, polycystic ovary syndrome (PCOS), kidney disease, liver disease, or hypothyroidism. Not exercising regularly. Being overweight or obese with too much belly fat. What are the signs or symptoms? In most cases, dyslipidemia does not usually cause any symptoms. In severe cases, very high lipid levels can  cause: Fatty bumps under the skin (xanthomas). A white or gray ring around the black center (pupil) of the eye. Very high triglyceride levels can cause inflammation of the pancreas (pancreatitis). How is this diagnosed? Your health care provider may diagnose dyslipidemia based on a routine blood test (fasting blood test). Because most people do not have symptoms of the condition, this blood testing (lipid profile) is done on adults age 20 and older and is repeated every 4-6 years. This test checks: Total cholesterol. This measures the total amount of cholesterol in your blood, including LDL cholesterol, HDL cholesterol, and triglycerides. A healthy number is below 200 mg/dL (5.17 mmol/L). LDL cholesterol. The target number for LDL cholesterol is different for each person, depending on individual risk factors. A healthy number is usually below 100 mg/dL (2.59 mmol/L). Ask your health care provider what your LDL cholesterol should be. HDL cholesterol. An HDL level of 60 mg/dL (1.55 mmol/L) or higher is best because it helps to protect against heart disease. A number below 40 mg/dL (1.03 mmol/L) for men or below 50 mg/dL (1.29 mmol/L) for women increases the risk for heart disease. Triglycerides. A healthy triglyceride number is below 150 mg/dL (1.69 mmol/L). If your lipid profile is abnormal, your health care provider may do other blood tests. How is this treated? Treatment depends on the type of dyslipidemia that you have and your other risk factors for heart disease and stroke. Your health care provider will have a target range for your lipid levels based on this information. Treatment for dyslipidemia starts with lifestyle changes, such as diet and exercise. Your health care provider may recommend that you: Get regular exercise. Make changes to your diet. Quit smoking if you smoke. Limit your alcohol intake. If diet   changes and exercise do not help you reach your goals, your health care provider  may also prescribe medicine to lower lipids. The most commonly prescribed type of medicine lowers your LDL cholesterol (statin drug). If you have a high triglyceride level, your provider may prescribe another type of drug (fibrate) or an omega-3 fish oil supplement, or both. Follow these instructions at home: Eating and drinking  Follow instructions from your health care provider or dietitian about eating or drinking restrictions. Eat a healthy diet as told by your health care provider. This can help you reach and maintain a healthy weight, lower your LDL cholesterol, and raise your HDL cholesterol. This may include: Limiting your calories, if you are overweight. Eating more fruits, vegetables, whole grains, fish, and lean meats. Limiting saturated fat, trans fat, and cholesterol. Do not drink alcohol if: Your health care provider tells you not to drink. You are pregnant, may be pregnant, or are planning to become pregnant. If you drink alcohol: Limit how much you have to: 0-1 drink a day for women. 0-2 drinks a day for men. Know how much alcohol is in your drink. In the [place] one drink equals one 12 oz bottle of beer (355 mL), one 5 oz glass of wine (148 mL), or one 1 oz glass of hard liquor (44 mL). Activity Get regular exercise. Start an exercise and strength training program as told by your health care provider. Ask your health care provider what activities are safe for you. Your health care provider may recommend: 30 minutes of aerobic activity 4-6 days a week. Brisk walking is an example of aerobic activity. Strength training 2 days a week. General instructions Do not use any products that contain nicotine or tobacco. These products include cigarettes, chewing tobacco, and vaping devices, such as e-cigarettes. If you need help quitting, ask your health care provider. Take over-the-counter and prescription medicines only as told by your health care provider. This includes  supplements. Keep all follow-up visits. This is important. Contact a health care provider if: You are having trouble sticking to your exercise or diet plan. You are struggling to quit smoking or to control your use of alcohol. Summary Dyslipidemia often involves a high level of cholesterol or triglycerides, which are types of lipids. Treatment depends on the type of dyslipidemia that you have and your other risk factors for heart disease and stroke. Treatment for dyslipidemia starts with lifestyle changes, such as diet and exercise. Your health care provider may prescribe medicine to lower lipids. This information is not intended to replace advice given to you by your health care provider. Make sure you discuss any questions you have with your health care provider. Document Revised: 02/02/2022 Document Reviewed: 09/05/2020 Elsevier Patient Education  2023 [employer]  

## 2022-09-05 NOTE — Progress Notes (Signed)
[place]  Sissi Moronta, is a 42 y.o. female  [id number]  [id number]  DOB - [birth year]  Chief Complaint  Patient presents with   Insomnia    Follow up        Subjective:   Ashley Li is a 42 y.o. female here today for a follow up visit for insomnia. Previously Trazodone taking 76m which is no longer working. She was able sleeping 7-8 hours now 3-4 hours. She also, stated she has been stressed her niece had a stroke which is a factor of reduce sleeping.  She voices concerns about "knot" on her left upper frontal  head- causes intermittent pain. Requesting referral. Patient has No headache, No chest pain, No abdominal pain - No Nausea, No new weakness tingling or numbness, No Cough - shortness of breath.  No problems updated.  No Known Allergies  Past Medical History:  Diagnosis Date   Hypertension     Current Outpatient Medications on File Prior to Visit  Medication Sig Dispense Refill   metroNIDAZOLE (FLAGYL) 500 MG tablet Take 1 tablet (500 mg total) by mouth 2 (two) times daily. 14 tablet 0   amLODipine (NORVASC) 5 MG tablet Take 1 tablet (5 mg total) by mouth daily. 90 tablet 1   atorvastatin (LIPITOR) 20 MG tablet Take 1 tablet (20 mg total) by mouth daily. 90 tablet 1   etonogestrel (NEXPLANON) 68 MG IMPL implant 1 each by Subdermal route once.     hydrochlorothiazide (HYDRODIURIL) 25 MG tablet Take 1 tablet (25 mg total) by mouth daily. 90 tablet 3   levocetirizine (XYZAL ALLERGY 24HR) 5 MG tablet Take 1 tablet (5 mg total) by mouth every evening. 90 tablet 1   Multiple Vitamin (MULTIVITAMIN) capsule Take 1 capsule by mouth daily.     traZODone (DESYREL) 50 MG tablet Take 0.5-1 tablets (25-50 mg total) by mouth at bedtime as needed for sleep. 60 tablet 1   No current facility-administered medications on file prior to visit.    Objective:   Vitals:   09/05/22 0922  BP: 117/78  Pulse: 73  Resp: 16  SpO2: 95%  Weight: 182 lb 9.6 oz  (82.8 kg)  Height: 5' 2.5" (1.588 m)    Comprehensive ROS Pertinent positive and negative noted in HPI   Exam General appearance : Awake, alert, not in any distress. Speech Clear. Not toxic looking HEENT: Atraumatic and Normocephalic, pupils equally reactive to light and accomodation Neck: Supple, no JVD. No cervical lymphadenopathy.  Chest: Good air entry bilaterally, no added sounds  CVS: S1 S2 regular, no murmurs.  Abdomen: Bowel sounds present, Non tender and not distended with no gaurding, rigidity or rebound. Extremities: B/L Lower Ext shows no edema, both legs are warm to touch Neurology: Awake alert, and oriented X 3, CN II-XII intact, Non focal Skin: No Rash  Data Review No results found for: "HGBA1C"  Assessment & Plan  [name] seen today for insomnia.  Diagnoses and all orders for this visit:  Insomnia, unspecified type 2/2 Stress and adjustment reaction Increased Trazodone to 75 mg at bedtime for sleep.  Dyslipidemia, goal LDL below 70  Information provided to reduce LDL with lifestyle modification. Patient is in agreement.   Lipoma of face -     Cancel: Ambulatory referral to Dermatology -     Ambulatory referral to General Surgery  Other orders -     traZODone (DESYREL) 50 MG tablet; TAKE 1 1/2 TABLET AT BED TIME AS NEEDED FOR INSOMNIA  Patient have been counseled extensively about nutrition and exercise. Other issues discussed during this visit include: low cholesterol diet, weight control and daily exercise, foot care, annual eye examinations at Ophthalmology, importance of adherence with medications and regular follow-up. We also discussed long term complications of uncontrolled diabetes and hypertension.   No follow-ups on file.  The patient was given clear instructions to go to ER or return to medical center if symptoms don't improve, worsen or new problems develop. The patient verbalized understanding. The patient was told to call to get lab  results if they haven't heard anything in the next week.   This note has been created with Surveyor, quantity. Any transcriptional errors are unintentional.   Ashley Perna, NP 09/05/2022, 9:26 AM

## 2022-09-12 ENCOUNTER — Telehealth (INDEPENDENT_AMBULATORY_CARE_PROVIDER_SITE_OTHER): Payer: Self-pay | Admitting: Primary Care

## 2022-09-12 NOTE — Telephone Encounter (Signed)
Cat from [facility] states that they received a referral for the pt but they do not do anything with the face. Pt will need a referral to another location.

## 2022-09-13 ENCOUNTER — Other Ambulatory Visit (INDEPENDENT_AMBULATORY_CARE_PROVIDER_SITE_OTHER): Payer: Self-pay | Admitting: Primary Care

## 2022-09-13 DIAGNOSIS — D170 Benign lipomatous neoplasm of skin and subcutaneous tissue of head, face and neck: Secondary | ICD-10-CM

## 2022-09-13 NOTE — Telephone Encounter (Signed)
Will forward to Alinda Sierras and provider

## 2022-09-13 NOTE — Telephone Encounter (Signed)
Spoke with patient referring dermatology

## 2022-09-14 ENCOUNTER — Telehealth: Payer: Self-pay | Admitting: Primary Care

## 2022-09-14 NOTE — Telephone Encounter (Signed)
-----   Message from Shann Medal, [place] sent at 09/06/2022 11:41 AM EST ----- Please follow up and evaluate clients needs from notes and client. Provide resources as needed.  ----- Message ----- From: Kerin Perna, NP Sent: 09/05/2022   9:43 AM EST To: Shann Medal, LCSW  Patient would benefit from LCSW intervention

## 2022-10-08 ENCOUNTER — Other Ambulatory Visit (INDEPENDENT_AMBULATORY_CARE_PROVIDER_SITE_OTHER): Payer: Self-pay | Admitting: Primary Care

## 2022-10-08 DIAGNOSIS — I10 Essential (primary) hypertension: Secondary | ICD-10-CM

## 2022-10-08 NOTE — Telephone Encounter (Signed)
Will forward to provider  

## 2022-11-19 ENCOUNTER — Ambulatory Visit (INDEPENDENT_AMBULATORY_CARE_PROVIDER_SITE_OTHER): Payer: Self-pay | Admitting: *Deleted

## 2022-11-19 ENCOUNTER — Encounter (INDEPENDENT_AMBULATORY_CARE_PROVIDER_SITE_OTHER): Payer: Self-pay

## 2022-11-19 NOTE — Telephone Encounter (Signed)
  Chief Complaint: abdominal pain Symptoms: abdominal pain "all over" comes and goes mild to moderate. Now having diarrhea dull pain Frequency: 1 week for abdominal pain diarrhea since Saturday  Pertinent Negatives: Patient denies fever, no N/V Disposition: [] ED /[] Urgent Care (no appt availability in office) / [x] Appointment(In office/virtual)/ []  [facility]/ [] Home Care/ [] Refused Recommended Disposition /[] [place] [facility]/ []  Follow-up with PCP Additional Notes:   Scheduled appt for 11/27/22. None available earlier. On wait list. Please advise . Recommended if sx worsening go to UC /ED.     Reason for Disposition  [1] MILD pain (e.g., does not interfere with normal activities) AND [2] pain comes and goes (cramps) AND [3] present > 48 hours  (Exception: This same abdominal pain is a chronic symptom recurrent or ongoing AND present > 4 weeks.)  Answer Assessment - Initial Assessment Questions 1. LOCATION: "Where does it hurt?"      All around abdomen 2. RADIATION: "Does the pain shoot anywhere else?" (e.g., chest, back)     no 3. ONSET: "When did the pain begin?" (e.g., minutes, hours or days ago)      Diarrhea Saturday and abdominal 1 week  4. SUDDEN: "Gradual or sudden onset?"     Gradual  5. PATTERN "Does the pain come and go, or is it constant?"    - If it comes and goes: "How long does it last?" "Do you have pain now?"     (Note: Comes and goes means the pain is intermittent. It goes away completely between bouts.)    - If constant: "Is it getting better, staying the same, or getting worse?"      (Note: Constant means the pain never goes away completely; most serious pain is constant and gets worse.)      Comes and goes  6. SEVERITY: "How bad is the pain?"  (e.g., Scale 1-10; mild, moderate, or severe)    - MILD (1-3): Doesn't interfere with normal activities, abdomen soft and not tender to touch.     - MODERATE (4-7): Interferes with normal activities or  awakens from sleep, abdomen tender to touch.     - SEVERE (8-10): Excruciating pain, doubled over, unable to do any normal activities.       Mild  7. RECURRENT SYMPTOM: "Have you ever had this type of stomach pain before?" If Yes, ask: "When was the last time?" and "What happened that time?"      na 8. CAUSE: "What do you think is causing the stomach pain?"     na 9. RELIEVING/AGGRAVATING FACTORS: "What makes it better or worse?" (e.g., antacids, bending or twisting motion, bowel movement)     After BM feels better  10. OTHER SYMPTOMS: "Do you have any other symptoms?" (e.g., back pain, diarrhea, fever, urination pain, vomiting)      diarrhea 11. PREGNANCY: "Is there any chance you are pregnant?" "When was your last menstrual period?"       Na  Protocols used: Abdominal Pain - [facility]

## 2022-11-19 NOTE — Telephone Encounter (Signed)
Returned pt call and made her aware that provider doesn't have any sooner this week. Offered pt an appt for 11/26/22 at 9:56m. Pt states that appt is better  If symtpoms gets worse be seen at the urgent care or ED

## 2022-11-26 ENCOUNTER — Ambulatory Visit (INDEPENDENT_AMBULATORY_CARE_PROVIDER_SITE_OTHER): Payer: Commercial Managed Care - PPO | Admitting: Primary Care

## 2022-11-26 ENCOUNTER — Encounter (INDEPENDENT_AMBULATORY_CARE_PROVIDER_SITE_OTHER): Payer: Self-pay | Admitting: Primary Care

## 2022-11-26 VITALS — BP 128/85 | HR 72 | Resp 16 | Wt 180.2 lb

## 2022-11-26 DIAGNOSIS — F4329 Adjustment disorder with other symptoms: Secondary | ICD-10-CM

## 2022-11-26 DIAGNOSIS — R1084 Generalized abdominal pain: Secondary | ICD-10-CM

## 2022-11-27 ENCOUNTER — Ambulatory Visit (INDEPENDENT_AMBULATORY_CARE_PROVIDER_SITE_OTHER): Payer: Commercial Managed Care - PPO | Admitting: Primary Care

## 2022-12-02 ENCOUNTER — Encounter (INDEPENDENT_AMBULATORY_CARE_PROVIDER_SITE_OTHER): Payer: Self-pay | Admitting: Primary Care

## 2022-12-02 NOTE — Progress Notes (Signed)
[facility]   Subjective:   Ashley Li is a 42 y.o. female Patient presents today for abdominal pain. The pain is described as aching, cramping, and pressure-like, and is 6/10 in intensity. Pain is located in the diffusely without radiation. Onset was 3 months ago. Symptoms have been gradually worsening since. Aggravating factors: stress/anxiety.  Alleviating factors: none. Associated symptoms: none. The patient denies none.  Symptoms Nausea/Vomiting: no  Diarrhea: no  Constipation: no  Melena/BRBPR: no  Hematemesis: no  Anorexia: no  Fever/Chills: no  Dysuria: no  Rash: no  Wt loss: no  EtOH use: no  NSAIDs/ASA: yes  LMP: No LMP recorded. Patient has had an implant.  Vaginal bleeding: no  STD risk/hx: no   Past Medical History:  Diagnosis Date   Hypertension    No past surgical history on file. No Known Allergies Current Outpatient Medications on File Prior to Visit  Medication Sig Dispense Refill   amLODipine (NORVASC) 5 MG tablet TAKE 1 TABLET(5 MG) BY MOUTH DAILY 90 tablet 1   atorvastatin (LIPITOR) 20 MG tablet Take 1 tablet (20 mg total) by mouth daily. 90 tablet 1   etonogestrel (NEXPLANON) 68 MG IMPL implant 1 each by Subdermal route once.     hydrochlorothiazide (HYDRODIURIL) 25 MG tablet Take 1 tablet (25 mg total) by mouth daily. 90 tablet 3   levocetirizine (XYZAL ALLERGY 24HR) 5 MG tablet Take 1 tablet (5 mg total) by mouth every evening. 90 tablet 1   Multiple Vitamin (MULTIVITAMIN) capsule Take 1 capsule by mouth daily.     traZODone (DESYREL) 50 MG tablet TAKE 1 1/2 TABLET AT BED TIME AS NEEDED FOR INSOMNIA 90 tablet 2   No current facility-administered medications on file prior to visit.   Social History   Socioeconomic History   Marital status: Single    Spouse name: Not on file   Number of children: Not on file   Years of education: Not on file   Highest education level: 12th grade  Occupational History   Not on file   Tobacco Use   Smoking status: Never   Smokeless tobacco: Never  Vaping Use   Vaping Use: Never used  Substance and Sexual Activity   Alcohol use: Yes    Comment: socially    Drug use: No   Sexual activity: Yes    Birth control/protection: Inserts  Other Topics Concern   Not on file  Social History Narrative   Not on file   Social Determinants of Health   Financial Resource Strain: Medium Risk (11/22/2022)   Overall Financial Resource Strain (CARDIA)    Difficulty of Paying Living Expenses: Somewhat hard  Food Insecurity: Food Insecurity Present (11/22/2022)   Hunger Vital Sign    Worried About Running Out of Food in the Last Year: Sometimes true    Ran Out of Food in the Last Year: Sometimes true  Transportation Needs: No Transportation Needs (11/22/2022)   PRAPARE - Administrator, Civil Service (Medical): No    Lack of Transportation (Non-Medical): No  Physical Activity: Unknown (11/22/2022)   Exercise Vital Sign    Days of Exercise per Week: 0 days    Minutes of Exercise per Session: Not on file  Stress: Stress Concern Present (11/22/2022)   [employer] of Occupational Health - Occupational Stress Questionnaire    Feeling of Stress : Very much  Social Connections: Moderately Isolated (11/22/2022)   Social Connection and Isolation Panel [NHANES]    Frequency  of Communication with Friends and Family: More than three times a week    Frequency of Social Gatherings with Friends and Family: More than three times a week    Attends Religious Services: More than 4 times per year    Active Member of [employer] or Organizations: No    Attends Engineer, structural: Not on file    Marital Status: Divorced  Intimate Partner Violence: Not on file   No family history on file.  OBJECTIVE:  Vitals:   11/26/22 0910  BP: 128/85  Pulse: 72  Resp: 16  SpO2: 100%  Weight: 180 lb 3.2 oz (81.7 kg)    ROS Comprehensive ROS Pertinent positive and negative noted in HPI     ASSESSMENT & PLAN:  Ashley Li was seen today for abdominal pain.  Diagnoses and all orders for this visit:  Generalized abdominal pain 2/2 Stress and adjustment reaction Refer to clinical social worker for counseling.  Reevaluate the need for medication  Discussed eating small frequent meal, reduction in acidic foods, fried foods ,spicy foods, alcohol caffeine and tobacco and certain medications. Avoid laying down after eating 82mins-1hour, elevated head of the bed.       This note has been created with Education officer, environmental. Any transcriptional errors are unintentional.   Ashley Sessions, NP 12/02/2022, 6:27 PM

## 2022-12-04 ENCOUNTER — Encounter (INDEPENDENT_AMBULATORY_CARE_PROVIDER_SITE_OTHER): Payer: Self-pay | Admitting: Primary Care

## 2022-12-04 ENCOUNTER — Ambulatory Visit (INDEPENDENT_AMBULATORY_CARE_PROVIDER_SITE_OTHER): Payer: Commercial Managed Care - PPO | Admitting: Primary Care

## 2022-12-04 VITALS — BP 123/85 | HR 71 | Resp 16 | Wt 182.4 lb

## 2022-12-04 DIAGNOSIS — R1084 Generalized abdominal pain: Secondary | ICD-10-CM | POA: Diagnosis not present

## 2022-12-04 DIAGNOSIS — Z131 Encounter for screening for diabetes mellitus: Secondary | ICD-10-CM | POA: Diagnosis not present

## 2022-12-04 DIAGNOSIS — I10 Essential (primary) hypertension: Secondary | ICD-10-CM

## 2022-12-04 DIAGNOSIS — E782 Mixed hyperlipidemia: Secondary | ICD-10-CM

## 2022-12-04 DIAGNOSIS — G4700 Insomnia, unspecified: Secondary | ICD-10-CM

## 2022-12-04 DIAGNOSIS — F4329 Adjustment disorder with other symptoms: Secondary | ICD-10-CM | POA: Diagnosis not present

## 2022-12-04 MED ORDER — TRAZODONE HCL 50 MG PO TABS: ORAL_TABLET | ORAL | 2 refills | Status: DC

## 2022-12-04 NOTE — Progress Notes (Signed)
[facility]   Subjective:   Ashley Li is a 42 y.o. female Patient presents today for abdominal pain that resolve. She was having panic attacks and abdominal pain when going to work and arriving to start to- environment was hostile/ toxic. Patient has No headache, No chest pain, No abdominal pain - No Nausea, No new weakness tingling or numbness, No Cough - shortness of breath.  Past Medical History:  Diagnosis Date   Hypertension    No past surgical history on file. No Known Allergies Current Outpatient Medications on File Prior to Visit  Medication Sig Dispense Refill   amLODipine (NORVASC) 5 MG tablet TAKE 1 TABLET(5 MG) BY MOUTH DAILY 90 tablet 1   atorvastatin (LIPITOR) 20 MG tablet Take 1 tablet (20 mg total) by mouth daily. 90 tablet 1   etonogestrel (NEXPLANON) 68 MG IMPL implant 1 each by Subdermal route once.     hydrochlorothiazide (HYDRODIURIL) 25 MG tablet Take 1 tablet (25 mg total) by mouth daily. 90 tablet 3   levocetirizine (XYZAL ALLERGY 24HR) 5 MG tablet Take 1 tablet (5 mg total) by mouth every evening. 90 tablet 1   Multiple Vitamin (MULTIVITAMIN) capsule Take 1 capsule by mouth daily.     traZODone (DESYREL) 50 MG tablet TAKE 1 1/2 TABLET AT BED TIME AS NEEDED FOR INSOMNIA 90 tablet 2   No current facility-administered medications on file prior to visit.   Social History   Socioeconomic History   Marital status: Single    Spouse name: Not on file   Number of children: Not on file   Years of education: Not on file   Highest education level: 12th grade  Occupational History   Not on file  Tobacco Use   Smoking status: Never   Smokeless tobacco: Never  Vaping Use   Vaping Use: Never used  Substance and Sexual Activity   Alcohol use: Yes    Comment: socially    Drug use: No   Sexual activity: Yes    Birth control/protection: Inserts  Other Topics Concern   Not on file  Social History Narrative   Not on file   Social  Determinants of Health   Financial Resource Strain: Medium Risk (11/22/2022)   Overall Financial Resource Strain (CARDIA)    Difficulty of Paying Living Expenses: Somewhat hard  Food Insecurity: Food Insecurity Present (11/22/2022)   Hunger Vital Sign    Worried About Running Out of Food in the Last Year: Sometimes true    Ran Out of Food in the Last Year: Sometimes true  Transportation Needs: No Transportation Needs (11/22/2022)   PRAPARE - Administrator, Civil Service (Medical): No    Lack of Transportation (Non-Medical): No  Physical Activity: Unknown (11/22/2022)   Exercise Vital Sign    Days of Exercise per Week: 0 days    Minutes of Exercise per Session: Not on file  Stress: Stress Concern Present (11/22/2022)   [employer] of Occupational Health - Occupational Stress Questionnaire    Feeling of Stress : Very much  Social Connections: Moderately Isolated (11/22/2022)   Social Connection and Isolation Panel [NHANES]    Frequency of Communication with Friends and Family: More than three times a week    Frequency of Social Gatherings with Friends and Family: More than three times a week    Attends Religious Services: More than 4 times per year    Active Member of [employer] or Organizations: No    Attends Club or  Organization Meetings: Not on file    Marital Status: Divorced  Intimate Partner Violence: Not on file   No family history on file.  OBJECTIVE:  Vitals:   12/04/22 0844  BP: 123/85  Pulse: 71  Resp: 16  SpO2: 95%  Weight: 182 lb 6.4 oz (82.7 kg)    ROS Comprehensive ROS Pertinent positive and negative noted in HPI    ASSESSMENT & PLAN:  Ashley Li was seen today for hypertension.  Diagnoses and all orders for this visit:  Generalized abdominal pain/ Stress and adjustment reaction  Resolved after resigning from her job  Essential hypertension BP goal - < 130/80 controlled Explained that having normal blood pressure is the goal and medications are  helping to get to goal and maintain normal blood pressure. DIET: Limit salt intake, read nutrition labels to check salt content, limit fried and high fatty foods  Avoid using multisymptom OTC cold preparations that generally contain sudafed which can rise BP. Consult with pharmacist on best cold relief products to use for persons with HTN EXERCISE Discussed incorporating exercise such as walking - 30 minutes most days of the week and can do in 10 minute intervals     Insomnia, unspecified type Works well sleep in . -     traZODone (DESYREL) 50 MG tablet; TAKE 1 1/2 TABLET AT BED TIME AS NEEDED FOR INSOMNIA  Mixed hyperlipidemia On a statin -     Lipid Panel   Screening for diabetes mellitus  A1C  This note has been created with Education officer, environmental. Any transcriptional errors are unintentional.   Grayce Sessions, NP 12/04/2022, 9:06 AM

## 2022-12-05 ENCOUNTER — Other Ambulatory Visit (INDEPENDENT_AMBULATORY_CARE_PROVIDER_SITE_OTHER): Payer: Self-pay | Admitting: Primary Care

## 2022-12-05 DIAGNOSIS — I10 Essential (primary) hypertension: Secondary | ICD-10-CM

## 2022-12-05 LAB — LIPID PANEL
Chol/HDL Ratio: 4 ratio (ref 0.0–4.4)
Cholesterol, Total: 163 mg/dL (ref 100–199)
HDL: 41 mg/dL (ref >39)
LDL Chol Calc (NIH): 105 mg/dL — ABNORMAL HIGH (ref 0–99)
Triglycerides: 88 mg/dL (ref 0–149)
VLDL Cholesterol Cal: 17 mg/dL (ref 5–40)

## 2022-12-05 LAB — CMP14+EGFR
ALT: 18 IU/L (ref 0–32)
AST: 17 IU/L (ref 0–40)
Albumin/Globulin Ratio: 1.8 (ref 1.2–2.2)
Albumin: 4.4 g/dL (ref 3.9–4.9)
Alkaline Phosphatase: 44 IU/L (ref 44–121)
BUN/Creatinine Ratio: 15 (ref 9–23)
BUN: 12 mg/dL (ref 6–20)
Bilirubin Total: 0.4 mg/dL (ref 0.0–1.2)
CO2: 25 mmol/L (ref 20–29)
Calcium: 9.8 mg/dL (ref 8.7–10.2)
Chloride: 103 mmol/L (ref 96–106)
Creatinine, Ser: 0.79 mg/dL (ref 0.57–1.00)
Globulin, Total: 2.4 g/dL (ref 1.5–4.5)
Glucose: 88 mg/dL (ref 70–99)
Potassium: 3.8 mmol/L (ref 3.5–5.2)
Sodium: 143 mmol/L (ref 134–144)
Total Protein: 6.8 g/dL (ref 6.0–8.5)
eGFR: 98 mL/min/{1.73_m2} (ref >59)

## 2022-12-05 LAB — HEMOGLOBIN A1C
Est. average glucose Bld gHb Est-mCnc: 137 mg/dL
Hgb A1c MFr Bld: 6.4 % — ABNORMAL HIGH (ref 4.8–5.6)

## 2022-12-05 NOTE — Telephone Encounter (Signed)
Will forward to provider  

## 2022-12-24 ENCOUNTER — Telehealth (INDEPENDENT_AMBULATORY_CARE_PROVIDER_SITE_OTHER): Payer: Self-pay | Admitting: Licensed Clinical Social Worker

## 2022-12-24 NOTE — Telephone Encounter (Signed)
LCSWA called patient today to introduce herself and to assess patients' mental health needs. Patient did not answer the phone. LCSWA was able to leave a brief message with the patient asking them to return the call. Patient was referred by PCP for Stress and adjustment reaction. Counseling Resources   [email or web address]  [facility] [address] [place], [place] [address] [phone] or [phone] Walk-in urgent care 24/7 for anyone  For [facility] ONLY New patient assessments and therapy walk-ins: Monday and Wednesday 8am-11am First and second Friday 1pm-5pm New patient psychiatry and medication management walk-ins: Mondays, Wednesdays, Thursdays, Fridays 8am-11am No psychiatry walk-ins on Tuesday   *Accepts all insurance and uninsured for Urgent Care needs *Accepts Medicaid and uninsured for outpatient treatment   [facility] (Therapy and psychiatry) [facility] (near [employer]) [address] [place], [place] [address] [phone] Fax: [phone] (INSURANCE REQUIRED-MEDICAID ACCEPTED)   [facility] Address: Four Locations  -[address] [place], [place], [place]                                 Phone: [phone] -[address], [place], [place]         Phone: [phone] -[address], [place], [place]                      Phone: [phone] -[address], [place], [place]         Phone: [phone] Age Range: Children, Adolescents, and Adults Specialty Areas: Depression, Anxiety, ADHD, Substance Abuse, Bipolar Disorder, etc.  [facility] Address: [address], [place], [place] Phone: [phone] Age Range: Children, Adults, and Elders Specialty Areas: Couple, Family, Group, Individual     [facility] Address: [address] [place], [place] Phone:  [phone] Age Range: Children, Adolescents, and Adults Specialty Areas: Individual, Family and Couples Therapy, and Substance Abuse  [facility] Address: [address] [place], [place] [address] Phone: [phone] Age Range: Adolescents/Teenagers and Adults Specialty Areas: Individual, Family, Couples, and Group Counseling   [facility] Address: [address] [place], [place] Phone: [phone] Age Range: Children and Adults Specialty Areas: Individual, Group, and Family counseling to people who have experienced domestic violence or sexual assault  [facility] Address: [address] [place], [place] [address] Phone: [phone] Age Range: Adults & Children (Ages 3+) Specialty Areas: Mental Health and Substance Abuse Counseling Services  [facility] Address: [address] [place], [place], [place] [address] Phone: [phone]             Age Range: All Ages  Specialty Areas: Common mental health diagnoses such as Anxiety, Depression, ADD/ADHD, Bipolar, and PTSD, Substance Abuse Evaluations and Counseling   [facility] [address] [place] [address] [place],  [place]  [address] [phone] Call for appointment  [facility] (Therapy only)  [facility] [address] [place], [place] [address] Monday - Friday: 8:30 a.m.-12 p.m. / 1 p.m.-2:30 p.m.  [facility] [address] [place], [place] [address] Monday-Friday: 8:30 a.m.-12 p.m. / 2-3:30 p.m. (INSURANCE REQUIRED -MEDICAID ACCEPTED) They do offer a sliding fee scale $20-$30/session   [facility] [address] [place], [place] [address] Phone: [phone]  [facility] [address] [place] [address]  Phone: [phone] (Does not accept Medicaid) (only one provider accepts Medicare)  [facility] [address] (at  [employer], [place] [address] (Accepts Medicaid and Medicare)  [facility]) [address]  [place], [place] [address]  Phone: [phone]  [address] [place], [place] [address] Phone: [phone] [facility] Medicaid) [facility] (Therapy only)  [address], [place], [place] [address] Phone: [phone]   [facility] (Therapy only)  [address] [place], [place] [address] Phone: [phone] [facility])   [facility] [address] Suite [place], [place] [address] Phone: [phone] (Accepts [place] Health Choice) [employer] [phone] [address] [place], [place] [address] Phone: [phone] [facility]) [facility] (Psychiatry only)  [phone] [address], [place], [place] [address] (Accepts Medicaid and Medicare) [facility] (Psychiatry and therapy)  [address] [place], [place] [address] [phone] [facility] Medicare) [facility] (psychiatry and therapy) [address], [place], [place] [address] [phone]  [facility] at [address], [place], [place] [address] [phone] Accept [address] [place], [place], [place], [place],  and the following types of Medicaid; Alliance, [place], Partners, [place], [place] Health Choice, [employer], Healthy [place], [place] Complete, and [place], as well as offering a Careers adviser and private payment options. Provides In-Office Appointments, Virtual Appointments, and Phone Consultations Offers medication management for ages 32 years old and up, including,  Medication Management for Suboxone and [facility] [phone] [address], [place], [place] [address] (Accepts Medicaid and Medicare)         19.  [facility] (therapy only)  [address]  [place], [place] [address]            [phone] (Accepts medicare) 20. [facility]  (Suboxone and methodone) [phone] [address], [place], [place] [address] To Be Eligible for Opioid Treatment at [facility] you must be at least 40 years of age you have already tried other interventions that were not successful such as opioid detox, inpatient rehab for opioids, or outpatient counseling specifically for opioid dependency your [facility] drug test must be completely free of benzodiazepines (klonopin, xanax, valium, ativan, or other benz) you have reliable transportation to the [facility] clinic in [place] you recognize that counseling is a critical component of [facility]' Opioid Program and you agree to attend all required counseling sessions you are committed to total drug abstinence and will conscientiously strive to remain free of alcohol, marijuana, and other illicit substances while in treatment you desire a peaceful treatment atmosphere in which personal responsibility and respect toward staff and clients is the norm   21. [facility] [address] [place], [place], [place] [address] Offers SAIOP (Substance Abuse Intensive Outpatient Program) [phone] 22. [facility] [address] [place], [place] [address] [phone] (Accepts medicare)  For those who are tech savvy, go on psychology today, type in your local city (i.e. [place]. ) and specify your insurance at the top of the screen after you search. (Medicaid if needed). You can also specify whether you are interested in therapy and psychiatry.  www.[email or web address]/us

## 2023-01-22 ENCOUNTER — Other Ambulatory Visit (INDEPENDENT_AMBULATORY_CARE_PROVIDER_SITE_OTHER): Payer: Self-pay | Admitting: Primary Care

## 2023-01-22 DIAGNOSIS — E782 Mixed hyperlipidemia: Secondary | ICD-10-CM

## 2023-01-23 NOTE — Telephone Encounter (Signed)
Requested Prescriptions  Pending Prescriptions Disp Refills   atorvastatin (LIPITOR) 20 MG tablet [Pharmacy Med Name: ATORVASTATIN 20MG  TABLETS] 90 tablet 1    Sig: TAKE 1 TABLET(20 MG) BY MOUTH DAILY     Cardiovascular:  Antilipid - Statins Failed - 01/22/2023  1:25 PM      Failed - Lipid Panel in normal range within the last 12 months    Cholesterol, Total  Date Value Ref Range Status  12/04/2022 163 100 - 199 mg/dL Final   LDL Chol Calc (NIH)  Date Value Ref Range Status  12/04/2022 105 (H) 0 - 99 mg/dL Final   HDL  Date Value Ref Range Status  12/04/2022 41 >39 mg/dL Final   Triglycerides  Date Value Ref Range Status  12/04/2022 88 0 - 149 mg/dL Final         Passed - Patient is not pregnant      Passed - Valid encounter within last 12 months    Recent Outpatient Visits           1 month ago Generalized abdominal pain   [place] [facility] Grayce Sessions, NP   1 month ago Generalized abdominal pain   [place] [facility] Grayce Sessions, NP   4 months ago Insomnia, unspecified type   [place] [facility] Grayce Sessions, NP   6 months ago Essential hypertension   [place] [facility] Grayce Sessions, NP   8 months ago Acute intractable headache, unspecified headache type    [facility] Grayce Sessions, NP       Future Appointments             In 1 month Randa Evens, Kinnie Scales, NP [facility]   In 1 month Terri Piedra, DO [facility]

## 2023-03-06 ENCOUNTER — Encounter (INDEPENDENT_AMBULATORY_CARE_PROVIDER_SITE_OTHER): Payer: Self-pay | Admitting: Primary Care

## 2023-03-06 ENCOUNTER — Ambulatory Visit (INDEPENDENT_AMBULATORY_CARE_PROVIDER_SITE_OTHER): Payer: Medicaid Other | Admitting: Primary Care

## 2023-03-06 VITALS — BP 125/82 | HR 88 | Resp 16 | Wt 191.4 lb

## 2023-03-06 DIAGNOSIS — Z23 Encounter for immunization: Secondary | ICD-10-CM

## 2023-03-06 DIAGNOSIS — I10 Essential (primary) hypertension: Secondary | ICD-10-CM | POA: Diagnosis not present

## 2023-03-06 DIAGNOSIS — G4700 Insomnia, unspecified: Secondary | ICD-10-CM | POA: Diagnosis not present

## 2023-03-06 DIAGNOSIS — E782 Mixed hyperlipidemia: Secondary | ICD-10-CM | POA: Diagnosis not present

## 2023-03-06 MED ORDER — ATORVASTATIN CALCIUM 20 MG PO TABS: 20.0000 mg | ORAL_TABLET | Freq: Every day | ORAL | 1 refills | Status: AC

## 2023-03-06 MED ORDER — AMLODIPINE BESYLATE 5 MG PO TABS: 5.0000 mg | ORAL_TABLET | Freq: Every day | ORAL | 1 refills | Status: AC

## 2023-03-06 MED ORDER — TRAZODONE HCL 50 MG PO TABS: ORAL_TABLET | ORAL | 2 refills | Status: AC

## 2023-03-06 NOTE — Patient Instructions (Signed)

## 2023-03-06 NOTE — Progress Notes (Signed)
[facility]  Ashley Li, is a 42 y.o. female  ONG:[id number]  GMW:[id number]  DOB - 12-31-1980  Chief Complaint  Patient presents with   Hypertension       Subjective:   Ashley Li is a 42 y.o. female here today for a follow up visit HTN. BP is unremarkable .Patient has No headache, No chest pain, No abdominal pain - No Nausea, No new weakness tingling or numbness, No Cough - shortness of breath. She is also concern about weight and would like to pursue weight loss.   No problems updated.  No Known Allergies  Past Medical History:  Diagnosis Date   Hypertension     Current Outpatient Medications on File Prior to Visit  Medication Sig Dispense Refill   etonogestrel (NEXPLANON) 68 MG IMPL implant 1 each by Subdermal route once.     hydrochlorothiazide (HYDRODIURIL) 25 MG tablet TAKE 1 TABLET(25 MG) BY MOUTH DAILY 90 tablet 3   levocetirizine (XYZAL ALLERGY 24HR) 5 MG tablet Take 1 tablet (5 mg total) by mouth every evening. 90 tablet 1   No current facility-administered medications on file prior to visit.    Objective:   Vitals:   03/06/23 0857  BP: 125/82  Pulse: 88  Resp: 16  SpO2: 96%  Weight: 191 lb 6.4 oz (86.8 kg)    Comprehensive ROS Pertinent positive and negative noted in HPI   Exam General appearance : Awake, alert, not in any distress. Speech Clear. Not toxic looking HEENT: Atraumatic and Normocephalic, pupils equally reactive to light and accomodation Neck: Supple, no JVD. No cervical lymphadenopathy.  Chest: Good air entry bilaterally, no added sounds  CVS: S1 S2 regular, no murmurs.  Abdomen: Bowel sounds present, Non tender and not distended with no gaurding, rigidity or rebound. Extremities: B/L Lower Ext shows no edema, both legs are warm to touch Neurology: Awake alert, and oriented X 3, CN II-XII intact, Non focal Skin: No Rash  Data Review Lab Results  Component Value Date   HGBA1C 6.4 (H) 12/04/2022     Assessment & Plan  Ashley Li was seen today for hypertension.  Diagnoses and all orders for this visit:  Essential hypertension Well controlled - stopped hydrochlorothiazide  -     amLODipine (NORVASC) 5 MG tablet; Take 1 tablet (5 mg total) by mouth daily.  Class 2 severe obesity due to excess calories with serious comorbidity in adult, unspecified BMI (HCC) -     Amb Ref to Medical Weight Management  Mixed hyperlipidemia -     atorvastatin (LIPITOR) 20 MG tablet; Take 1 tablet (20 mg total) by mouth daily.  Insomnia, unspecified type -     traZODone (DESYREL) 50 MG tablet; TAKE 1 1/2 TABLET AT BED TIME AS NEEDED FOR INSOMNIA      Patient have been counseled extensively about nutrition and exercise. Other issues discussed during this visit include: low cholesterol diet, weight control and daily exercise, foot care, annual eye examinations at Ophthalmology, importance of adherence with medications and regular follow-up. We also discussed long term complications of uncontrolled diabetes and hypertension.   Return in about 2 weeks (around 03/20/2023) for BP only.  The patient was given clear instructions to go to ER or return to medical center if symptoms don't improve, worsen or new problems develop. The patient verbalized understanding. The patient was told to call to get lab results if they haven't heard anything in the next week.   This note has been created with Dragon speech recognition  Scientist, product/process development. Any transcriptional errors are unintentional.   Grayce Sessions, NP 03/06/2023, 9:54 AM

## 2023-03-12 NOTE — Addendum Note (Signed)
Addended by: Herbert Deaner on: 03/12/2023 10:25 AM   Modules accepted: Orders

## 2023-03-19 ENCOUNTER — Ambulatory Visit (INDEPENDENT_AMBULATORY_CARE_PROVIDER_SITE_OTHER): Payer: BC Managed Care – PPO | Admitting: Dermatology

## 2023-03-19 ENCOUNTER — Encounter: Payer: Self-pay | Admitting: Dermatology

## 2023-03-19 VITALS — BP 123/81 | HR 81

## 2023-03-19 DIAGNOSIS — R220 Localized swelling, mass and lump, head: Secondary | ICD-10-CM

## 2023-03-19 DIAGNOSIS — D170 Benign lipomatous neoplasm of skin and subcutaneous tissue of head, face and neck: Secondary | ICD-10-CM

## 2023-03-19 NOTE — Patient Instructions (Signed)
Important Information  Due to recent changes in healthcare laws, you may see results of your pathology and/or laboratory studies on MyChart before the doctors have had a chance to review them. We understand that in some cases there may be results that are confusing or concerning to you. Please understand that not all results are received at the same time and often the doctors may need to interpret multiple results in order to provide you with the best plan of care or course of treatment. Therefore, we ask that you please give [place] 2 business days to thoroughly review all your results before contacting the office for clarification. Should we see a critical lab result, you will be contacted sooner.   If You Need Anything After Your Visit  If you have any questions or concerns for your doctor, please call our main line at [phone] If no one answers, please leave a voicemail as directed and we will return your call as soon as possible. Messages left after 4 pm will be answered the following business day.   You may also send [place] a message via MyChart. We typically respond to MyChart messages within 1-2 business days.  For prescription refills, please ask your pharmacy to contact our office. Our fax number is [phone].  If you have an urgent issue when the clinic is closed that cannot wait until the next business day, you can page your doctor at the number below.    Please note that while we do our best to be available for urgent issues outside of office hours, we are not available 24/7.   If you have an urgent issue and are unable to reach [place], you may choose to seek medical care at your doctor's office, retail clinic, urgent care center, or emergency room.  If you have a medical emergency, please immediately call 911 or go to the emergency department. In the event of inclement weather, please call our main line at [phone] for an update on the status of any delays or  closures.  Dermatology Medication Tips: Please keep the boxes that topical medications come in in order to help keep track of the instructions about where and how to use these. Pharmacies typically print the medication instructions only on the boxes and not directly on the medication tubes.   If your medication is too expensive, please contact our office at [phone] or send [place] a message through MyChart.   We are unable to tell what your co-pay for medications will be in advance as this is different depending on your insurance coverage. However, we may be able to find a substitute medication at lower cost or fill out paperwork to get insurance to cover a needed medication.   If a prior authorization is required to get your medication covered by your insurance company, please allow [place] 1-2 business days to complete this process.  Drug prices often vary depending on where the prescription is filled and some pharmacies may offer cheaper prices.  The website www.goodrx.com contains coupons for medications through different pharmacies. The prices here do not account for what the cost may be with help from insurance (it may be cheaper with your insurance), but the website can give you the price if you did not use any insurance.  - You can print the associated coupon and take it with your prescription to the pharmacy.  - You may also stop by our office during regular business hours and pick up a GoodRx coupon card.  - If  you need your prescription sent electronically to a different pharmacy, notify our office through [facility] or by phone at [phone]

## 2023-03-19 NOTE — Progress Notes (Signed)
   New Patient Visit   Subjective  Ashley Li is a 42 y.o. female who presents for the following: growth on forehead  Pt has a growth on her forehead for around 8-9 months.Pt feels like it is stable since it came up. She said some days it is very painful to touch. She has not had this spot treated in the past nor has she tried any otc treatments for it. She would like to discuss removal options for spot.Pt has no hx nor family hx of skin cancer.  The following portions of the chart were reviewed this encounter and updated as appropriate: medications, allergies, medical history  Review of Systems:  No other skin or systemic complaints except as noted in HPI or Assessment and Plan.  Objective  Well appearing patient in no apparent distress; mood and affect are within normal limits.  A focused examination was performed of the following areas: forehead  Relevant exam findings are noted in the Assessment and Plan.    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 Exam most consistent with a lipoma. Discussed that a lipoma is a benign fatty growth that can grow over time and sometimes get irritated. Recommend observation if it is not bothersome or changing. Discussed option of surgical excision to remove it if it is growing, symptomatic, or other changes noted. Please call for new or changing lesions so they can be evaluated.   Lipoma of face  Related Procedures US Soft Tissue Head/Neck (NON-THYROID)  Discussed with pt possible removal. She was told she would have sutures after the procedure and would need to take it easy for a few days after. She was made aware that she would have a thin line scar afterwards. Pt was told that she would have some mild discomfort and that we would give her instructions on what she could do for the discomfort as well as how to take care of  the surgical site.  Pt was told that she would like to have an ultrasound prior to procedure. - US Soft Tissue Head/Neck (NON-THYROID)  Return surgery after ultrasound.  I, Tillie Fantasia, CMA, am acting as scribe for Gwenith Daily, MD.   Documentation: I have reviewed the above documentation for accuracy and completeness, and I agree with the above.  Gwenith Daily, MD

## 2023-03-20 ENCOUNTER — Telehealth: Payer: Self-pay | Admitting: Primary Care

## 2023-03-20 ENCOUNTER — Ambulatory Visit (INDEPENDENT_AMBULATORY_CARE_PROVIDER_SITE_OTHER): Payer: Medicaid Other

## 2023-03-20 NOTE — Telephone Encounter (Signed)
Call pt back

## 2023-03-21 ENCOUNTER — Ambulatory Visit (INDEPENDENT_AMBULATORY_CARE_PROVIDER_SITE_OTHER): Payer: Medicaid Other

## 2023-03-21 ENCOUNTER — Ambulatory Visit: Payer: Commercial Managed Care - PPO | Admitting: Dermatology

## 2023-03-21 VITALS — BP 139/87 | HR 74 | Resp 16

## 2023-03-21 DIAGNOSIS — Z0130 Encounter for examination of blood pressure without abnormal findings: Secondary | ICD-10-CM

## 2023-03-27 ENCOUNTER — Telehealth (INDEPENDENT_AMBULATORY_CARE_PROVIDER_SITE_OTHER): Payer: Self-pay | Admitting: Primary Care

## 2023-03-27 NOTE — Telephone Encounter (Signed)
Copied from CRM [id number]. Topic: General - Other >> Mar 27, 2023  1:22 PM Phill Myron wrote: Irineo Axon: [place] Please call I have a question regarding my appt

## 2023-03-27 NOTE — Telephone Encounter (Signed)
Returned pt call  

## 2023-03-30 DIAGNOSIS — Z6834 Body mass index (BMI) 34.0-34.9, adult: Secondary | ICD-10-CM | POA: Diagnosis not present

## 2023-03-30 DIAGNOSIS — E6609 Other obesity due to excess calories: Secondary | ICD-10-CM | POA: Diagnosis not present

## 2023-03-30 DIAGNOSIS — E782 Mixed hyperlipidemia: Secondary | ICD-10-CM | POA: Diagnosis not present

## 2023-03-30 DIAGNOSIS — R7303 Prediabetes: Secondary | ICD-10-CM | POA: Diagnosis not present

## 2023-03-30 DIAGNOSIS — I119 Hypertensive heart disease without heart failure: Secondary | ICD-10-CM | POA: Diagnosis not present

## 2023-04-03 ENCOUNTER — Ambulatory Visit (HOSPITAL_COMMUNITY)
Admission: RE | Admit: 2023-04-03 | Discharge: 2023-04-03 | Disposition: A | Discharge status: Home / Self Care | Payer: BC Managed Care – PPO | Source: Ambulatory Visit | Attending: Dermatology | Admitting: Dermatology

## 2023-04-03 DIAGNOSIS — E041 Nontoxic single thyroid nodule: Secondary | ICD-10-CM | POA: Diagnosis not present

## 2023-04-03 DIAGNOSIS — D170 Benign lipomatous neoplasm of skin and subcutaneous tissue of head, face and neck: Secondary | ICD-10-CM | POA: Insufficient documentation

## 2023-04-04 ENCOUNTER — Other Ambulatory Visit (HOSPITAL_COMMUNITY)
Admission: RE | Admit: 2023-04-04 | Discharge: 2023-04-04 | Disposition: A | Discharge status: Home / Self Care | Payer: BC Managed Care – PPO | Source: Ambulatory Visit | Attending: Primary Care | Admitting: Primary Care

## 2023-04-04 ENCOUNTER — Ambulatory Visit (INDEPENDENT_AMBULATORY_CARE_PROVIDER_SITE_OTHER): Payer: Medicaid Other

## 2023-04-04 VITALS — BP 137/85

## 2023-04-04 DIAGNOSIS — Z113 Encounter for screening for infections with a predominantly sexual mode of transmission: Secondary | ICD-10-CM | POA: Diagnosis present

## 2023-04-04 DIAGNOSIS — Z0130 Encounter for examination of blood pressure without abnormal findings: Secondary | ICD-10-CM

## 2023-04-05 LAB — CERVICOVAGINAL ANCILLARY ONLY
Bacterial Vaginitis (gardnerella): POSITIVE — AB
Candida Glabrata: NEGATIVE
Candida Vaginitis: NEGATIVE
Chlamydia: NEGATIVE
Comment: NEGATIVE
Comment: NEGATIVE
Comment: NEGATIVE
Comment: NEGATIVE
Comment: NEGATIVE
Comment: NORMAL
Neisseria Gonorrhea: NEGATIVE
Trichomonas: NEGATIVE

## 2023-04-08 ENCOUNTER — Encounter (INDEPENDENT_AMBULATORY_CARE_PROVIDER_SITE_OTHER): Payer: Self-pay | Admitting: Primary Care

## 2023-04-08 ENCOUNTER — Telehealth (INDEPENDENT_AMBULATORY_CARE_PROVIDER_SITE_OTHER): Payer: Self-pay | Admitting: Primary Care

## 2023-04-08 ENCOUNTER — Other Ambulatory Visit (INDEPENDENT_AMBULATORY_CARE_PROVIDER_SITE_OTHER): Payer: Self-pay | Admitting: Primary Care

## 2023-04-08 DIAGNOSIS — B9689 Other specified bacterial agents as the cause of diseases classified elsewhere: Secondary | ICD-10-CM

## 2023-04-08 MED ORDER — METRONIDAZOLE 500 MG PO TABS: 500.0000 mg | ORAL_TABLET | Freq: Two times a day (BID) | ORAL | 0 refills | Status: DC

## 2023-04-08 MED ORDER — FLUCONAZOLE 150 MG PO TABS: 150.0000 mg | ORAL_TABLET | Freq: Every day | ORAL | 1 refills | Status: DC

## 2023-04-08 NOTE — Telephone Encounter (Signed)
Pt called in wanting to know if med is going to be called in , for her std positive results. Please cb

## 2023-04-08 NOTE — Telephone Encounter (Signed)
Provider hasn't looked at results. Will forward to provider

## 2023-04-11 ENCOUNTER — Ambulatory Visit (INDEPENDENT_AMBULATORY_CARE_PROVIDER_SITE_OTHER): Payer: BC Managed Care – PPO | Admitting: Dermatology

## 2023-04-11 ENCOUNTER — Encounter: Payer: Self-pay | Admitting: Dermatology

## 2023-04-11 VITALS — BP 122/78 | HR 84

## 2023-04-11 DIAGNOSIS — D170 Benign lipomatous neoplasm of skin and subcutaneous tissue of head, face and neck: Secondary | ICD-10-CM

## 2023-04-11 DIAGNOSIS — D485 Neoplasm of uncertain behavior of skin: Secondary | ICD-10-CM

## 2023-04-11 NOTE — Patient Instructions (Signed)

## 2023-04-11 NOTE — Progress Notes (Signed)
2.  Follow-Up Visit   Subjective  Ashley Li is a 42 y.o. female who presents for the following: Excision of neoplasm of skin on forehead  She had [place] of lesion, that suggested benign lesion such as lipoma, so we are proceeding with surgery today.   The following portions of the chart were reviewed this encounter and updated as appropriate: medications, allergies, medical history  Review of Systems:  No other skin or systemic complaints except as noted in HPI or Assessment and Plan.  Objective  Well appearing patient in no apparent distress; mood and affect are within normal limits.  A focused examination was performed of the following areas: forehead Relevant physical exam findings are noted in the Assessment and Plan.   left forehead subcutaneous rubbery nodule 2 1/2 x 2cm            Assessment & Plan   Neoplasm of uncertain behavior of skin left forehead  Skin excision  Lesion length (cm):  2.5 Lesion width (cm):  2 Total excision diameter (cm):  3 Informed consent: discussed and consent obtained   Timeout: patient name, date of birth, surgical site, and procedure verified   Procedure prep:  Patient was prepped and draped in usual sterile fashion Prep type:  Isopropyl alcohol and chlorhexidine Anesthesia: the lesion was anesthetized in a standard fashion   Anesthetic:  1% lidocaine w/ epinephrine 1-100,000 local infiltration Instrument used: #15 blade   Hemostasis achieved with: suture, pressure and electrodesiccation   Outcome: patient tolerated procedure well with no complications   Post-procedure details: sterile dressing applied and wound care instructions given   Dressing type: pressure dressing and bandage    Skin repair Complexity:  Complex Final length (cm):  3.3 Informed consent: discussed and consent obtained   Timeout: patient name, date of birth, surgical site, and procedure verified   Procedure prep:  Patient was prepped and draped in  usual sterile fashion Prep type:  Chlorhexidine Anesthesia: the lesion was anesthetized in a standard fashion   Anesthetic:  1% lidocaine w/ epinephrine 1-100,000 local infiltration Reason for type of repair: reduce tension to allow closure, reduce the risk of dehiscence, infection, and necrosis, preserve normal anatomy, preserve normal anatomical and functional relationships, allow side-to-side closure without requiring a flap or graft and enhance both functionality and cosmetic results   Undermining: area extensively undermined   Subcutaneous layers (deep stitches):  Suture size:  5-0 Suture type: Monocryl (poliglecaprone 25)   Stitches:  Buried vertical mattress Fine/surface layer approximation (top stitches):  Suture size:  6-0 Suture type: fast-absorbing plain gut   Stitches: simple running   Hemostasis achieved with: suture, pressure and electrodesiccation Outcome: patient tolerated procedure well with no complications   Post-procedure details: sterile dressing applied and wound care instructions given   Dressing type: petrolatum and pressure dressing    Specimen 1 - Surgical pathology Differential Diagnosis: R/O lipoma vs other soft tissue mass  Check Margins: No     Return if symptoms worsen or fail to improve.  I, Tillie Fantasia, CMA, am acting as scribe for Gwenith Daily, MD.   Documentation: I have reviewed the above documentation for accuracy and completeness, and I agree with the above.  Gwenith Daily, MD

## 2023-04-15 LAB — SURGICAL PATHOLOGY

## 2023-04-20 ENCOUNTER — Other Ambulatory Visit (INDEPENDENT_AMBULATORY_CARE_PROVIDER_SITE_OTHER): Payer: Self-pay | Admitting: Primary Care

## 2023-04-20 DIAGNOSIS — Z6834 Body mass index (BMI) 34.0-34.9, adult: Secondary | ICD-10-CM | POA: Diagnosis not present

## 2023-04-20 DIAGNOSIS — I10 Essential (primary) hypertension: Secondary | ICD-10-CM

## 2023-04-20 DIAGNOSIS — R7303 Prediabetes: Secondary | ICD-10-CM | POA: Diagnosis not present

## 2023-04-20 DIAGNOSIS — E782 Mixed hyperlipidemia: Secondary | ICD-10-CM | POA: Diagnosis not present

## 2023-04-20 DIAGNOSIS — I119 Hypertensive heart disease without heart failure: Secondary | ICD-10-CM | POA: Diagnosis not present

## 2023-04-20 DIAGNOSIS — E6609 Other obesity due to excess calories: Secondary | ICD-10-CM | POA: Diagnosis not present

## 2023-04-22 NOTE — Telephone Encounter (Signed)
Requested Prescriptions  Refused Prescriptions Disp Refills   amLODipine (NORVASC) 5 MG tablet [Pharmacy Med Name: AMLODIPINE BESYLATE 5MG  TABLETS] 90 tablet 1    Sig: TAKE 1 TABLET(5 MG) BY MOUTH DAILY     Cardiovascular: Calcium Channel Blockers 2 Passed - 04/20/2023  4:01 AM      Passed - Last BP in normal range    BP Readings from Last 1 Encounters:  04/11/23 122/78         Passed - Last Heart Rate in normal range    Pulse Readings from Last 1 Encounters:  04/11/23 84         Passed - Valid encounter within last 6 months    Recent Outpatient Visits           1 month ago Essential hypertension   [place] [facility] Grayce Sessions, NP   4 months ago Generalized abdominal pain   [place] [facility] Grayce Sessions, NP   4 months ago Generalized abdominal pain   [place] [facility] Grayce Sessions, NP   7 months ago Insomnia, unspecified type   [place] [facility] Grayce Sessions, NP   9 months ago Essential hypertension   [place] [facility] Grayce Sessions, NP       Future Appointments             In 1 month Randa Evens, Kinnie Scales, NP [place] [facility]

## 2023-05-05 ENCOUNTER — Ambulatory Visit
Admission: RE | Admit: 2023-05-05 | Discharge: 2023-05-05 | Disposition: A | Discharge status: Home / Self Care | Payer: BC Managed Care – PPO | Source: Ambulatory Visit | Attending: Primary Care | Admitting: Primary Care

## 2023-05-05 ENCOUNTER — Other Ambulatory Visit: Payer: Self-pay | Admitting: Primary Care

## 2023-05-05 DIAGNOSIS — Z1231 Encounter for screening mammogram for malignant neoplasm of breast: Secondary | ICD-10-CM

## 2023-05-08 ENCOUNTER — Other Ambulatory Visit: Payer: Self-pay | Admitting: Primary Care

## 2023-05-08 DIAGNOSIS — R928 Other abnormal and inconclusive findings on diagnostic imaging of breast: Secondary | ICD-10-CM

## 2023-05-11 ENCOUNTER — Other Ambulatory Visit: Payer: Self-pay

## 2023-05-11 ENCOUNTER — Emergency Department (HOSPITAL_COMMUNITY)
Admission: EM | Admit: 2023-05-11 | Discharge: 2023-05-11 | Disposition: A | Discharge status: Home / Self Care | Payer: BC Managed Care – PPO | Attending: Emergency Medicine | Admitting: Emergency Medicine

## 2023-05-11 ENCOUNTER — Encounter (HOSPITAL_COMMUNITY): Payer: Self-pay

## 2023-05-11 DIAGNOSIS — Z79899 Other long term (current) drug therapy: Secondary | ICD-10-CM | POA: Diagnosis not present

## 2023-05-11 DIAGNOSIS — I10 Essential (primary) hypertension: Secondary | ICD-10-CM | POA: Diagnosis not present

## 2023-05-11 DIAGNOSIS — R519 Headache, unspecified: Secondary | ICD-10-CM | POA: Diagnosis present

## 2023-05-11 DIAGNOSIS — G44209 Tension-type headache, unspecified, not intractable: Secondary | ICD-10-CM

## 2023-05-11 DIAGNOSIS — R110 Nausea: Secondary | ICD-10-CM | POA: Insufficient documentation

## 2023-05-11 LAB — CBC
HCT: 44.4 % (ref 36.0–46.0)
Hemoglobin: 15.2 g/dL — ABNORMAL HIGH (ref 12.0–15.0)
MCH: 30.3 pg (ref 26.0–34.0)
MCHC: 34.2 g/dL (ref 30.0–36.0)
MCV: 88.6 fL (ref 80.0–100.0)
Platelets: 345 10*3/uL (ref 150–400)
RBC: 5.01 MIL/uL (ref 3.87–5.11)
RDW: 11.9 % (ref 11.5–15.5)
WBC: 4.8 10*3/uL (ref 4.0–10.5)
nRBC: 0 % (ref 0.0–0.2)

## 2023-05-11 LAB — COMPREHENSIVE METABOLIC PANEL
ALT: 17 U/L (ref 0–44)
AST: 15 U/L (ref 15–41)
Albumin: 4.3 g/dL (ref 3.5–5.0)
Alkaline Phosphatase: 41 U/L (ref 38–126)
Anion gap: 7 (ref 5–15)
BUN: 13 mg/dL (ref 6–20)
CO2: 24 mmol/L (ref 22–32)
Calcium: 9.3 mg/dL (ref 8.9–10.3)
Chloride: 103 mmol/L (ref 98–111)
Creatinine, Ser: 0.84 mg/dL (ref 0.44–1.00)
GFR, Estimated: >60 mL/min (ref >60)
Glucose, Bld: 102 mg/dL — ABNORMAL HIGH (ref 70–99)
Potassium: 4 mmol/L (ref 3.5–5.1)
Sodium: 134 mmol/L — ABNORMAL LOW (ref 135–145)
Total Bilirubin: 0.8 mg/dL (ref 0.3–1.2)
Total Protein: 7.5 g/dL (ref 6.5–8.1)

## 2023-05-11 LAB — HCG, SERUM, QUALITATIVE: Preg, Serum: NEGATIVE

## 2023-05-11 MED ORDER — ONDANSETRON 4 MG PO TBDP
4.0000 mg | ORAL_TABLET | Freq: Once | ORAL | Status: AC
  Administered 2023-05-11: 4 mg via ORAL
  Filled 2023-05-11: qty 1

## 2023-05-11 MED ORDER — SODIUM CHLORIDE 0.9 % IV BOLUS
1000.0000 mL | Freq: Once | INTRAVENOUS | Status: AC
  Administered 2023-05-11: 1000 mL via INTRAVENOUS

## 2023-05-11 MED ORDER — KETOROLAC TROMETHAMINE 15 MG/ML IJ SOLN
15.0000 mg | Freq: Once | INTRAMUSCULAR | Status: AC
  Administered 2023-05-11: 15 mg via INTRAVENOUS
  Filled 2023-05-11: qty 1

## 2023-05-11 MED ORDER — METOCLOPRAMIDE HCL 5 MG/ML IJ SOLN
10.0000 mg | Freq: Once | INTRAMUSCULAR | Status: AC
  Administered 2023-05-11: 10 mg via INTRAVENOUS
  Filled 2023-05-11: qty 2

## 2023-05-11 MED ORDER — MAGNESIUM SULFATE 2 GM/50ML IV SOLN
2.0000 g | Freq: Once | INTRAVENOUS | Status: AC
  Administered 2023-05-11: 2 g via INTRAVENOUS
  Filled 2023-05-11: qty 50

## 2023-05-11 MED ORDER — DIPHENHYDRAMINE HCL 50 MG/ML IJ SOLN
12.5000 mg | Freq: Once | INTRAMUSCULAR | Status: AC
  Administered 2023-05-11: 12.5 mg via INTRAVENOUS
  Filled 2023-05-11: qty 1

## 2023-05-11 NOTE — ED Provider Notes (Signed)
[place] EMERGENCY DEPARTMENT AT [facility] Provider Note   CSN: [id number] Arrival date & time: 05/11/23  1130     History  Chief Complaint  Patient presents with   Headache    Ashley Li is a 42 y.o. female with a history of hypertension who presents ED today for headache.  Patient reports she woke up this morning with headache across her forehead and nausea.  She took her blood pressure at the time and had a reading of 138/103.  Her symptoms did not improve after taking her antihypertensives.  Headache has been persistent since this morning.  Denies weakness, dizziness, vision changes, vomiting, photophobia, phonophobia, or nuchal rigidity.  No recent head injury or trauma.  No additional complaints or concerns at this time.    Home Medications Prior to Admission medications   Medication Sig Start Date End Date Taking? Authorizing Provider  amLODipine (NORVASC) 5 MG tablet Take 1 tablet (5 mg total) by mouth daily. 03/06/23   Grayce Sessions, NP  atorvastatin (LIPITOR) 20 MG tablet Take 1 tablet (20 mg total) by mouth daily. 03/06/23   Grayce Sessions, NP  etonogestrel (NEXPLANON) 68 MG IMPL implant 1 each by Subdermal route once.    [provider]  fluconazole (DIFLUCAN) 150 MG tablet Take 1 tablet (150 mg total) by mouth daily. 04/08/23   Grayce Sessions, NP  levocetirizine (XYZAL ALLERGY 24HR) 5 MG tablet Take 1 tablet (5 mg total) by mouth every evening. 11/03/21   Grayce Sessions, NP  metroNIDAZOLE (FLAGYL) 500 MG tablet Take 1 tablet (500 mg total) by mouth 2 (two) times daily. 04/08/23   Grayce Sessions, NP  traZODone (DESYREL) 50 MG tablet TAKE 1 1/2 TABLET AT BED TIME AS NEEDED FOR INSOMNIA 03/06/23   Grayce Sessions, NP      Allergies    Patient has no known allergies.    Review of Systems   Review of Systems  Neurological:  Positive for headaches.  All other systems reviewed and are negative.   Physical  Exam Updated Vital Signs BP (!) 135/91 (BP Location: Right Arm)   Pulse 86   Temp 98.2 F (36.8 C) (Oral)   Resp 18   Ht 5' 2.5" (1.588 m)   Wt 83.9 kg   SpO2 100%   BMI 33.30 kg/m  Physical Exam Vitals and nursing note reviewed.  Constitutional:      General: She is not in acute distress.    Appearance: Normal appearance.  HENT:     Head: Normocephalic and atraumatic.     Mouth/Throat:     Mouth: Mucous membranes are moist.  Eyes:     Extraocular Movements: Extraocular movements intact.     Conjunctiva/sclera: Conjunctivae normal.     Pupils: Pupils are equal, round, and reactive to light.     Comments: No nystagmus  Cardiovascular:     Rate and Rhythm: Normal rate and regular rhythm.     Pulses: Normal pulses.     Heart sounds: Normal heart sounds.  Pulmonary:     Effort: Pulmonary effort is normal.     Breath sounds: Normal breath sounds.  Abdominal:     Palpations: Abdomen is soft.     Tenderness: There is no abdominal tenderness.  Musculoskeletal:     Cervical back: Normal range of motion. No rigidity or tenderness.  Skin:    General: Skin is warm and dry.     Findings: No rash.  Neurological:  General: No focal deficit present.     Mental Status: She is alert.     Sensory: No sensory deficit.     Motor: No weakness.  Psychiatric:        Mood and Affect: Mood normal.        Behavior: Behavior normal.    ED Results / Procedures / Treatments   Labs (all labs ordered are listed, but only abnormal results are displayed) Labs Reviewed  CBC - Abnormal; Notable for the following components:      Result Value   Hemoglobin 15.2 (*)    All other components within normal limits  COMPREHENSIVE METABOLIC PANEL - Abnormal; Notable for the following components:   Sodium 134 (*)    Glucose, Bld 102 (*)    All other components within normal limits  HCG, SERUM, QUALITATIVE    EKG None  Radiology No results found.  Procedures Procedures: not  indicated.   Medications Ordered in ED Medications  ondansetron (ZOFRAN-ODT) disintegrating tablet 4 mg (4 mg Oral Given 05/11/23 1147)  ketorolac (TORADOL) 15 MG/ML injection 15 mg (15 mg Intravenous Given 05/11/23 1354)  metoCLOPramide (REGLAN) injection 10 mg (10 mg Intravenous Given 05/11/23 1355)  diphenhydrAMINE (BENADRYL) injection 12.5 mg (12.5 mg Intravenous Given 05/11/23 1354)  magnesium sulfate IVPB 2 g 50 mL (0 g Intravenous Stopped 05/11/23 1425)  sodium chloride 0.9 % bolus 1,000 mL (1,000 mLs Intravenous New Bag/Given 05/11/23 1401)    ED Course/ Medical Decision Making/ A&P                                 Medical Decision Making Amount and/or Complexity of Data Reviewed Labs: ordered.  Risk Prescription drug management.   This patient presents to the ED for concern of headache, this involves an extensive number of treatment options, and is a complaint that carries with it a high risk of complications and morbidity.   Differential diagnosis includes: cluster headache, tension headache, migraine, dehydration, preeclampsia, etc.   Comorbidities  See HPI above   Additional History  Additional history obtained from previous records.   Lab Tests  I ordered and personally interpreted labs.  The pertinent results include:   CMP and CBC are within normal limits -  no acute electrolyte derangement, AKI, infection, or anemia.  Negative pregnancy test.   Problem List / ED Course / Critical Interventions / Medication Management  Headache I ordered medications including: Ketorolac, Benadryl, Reglan, and magnesium for headache  Reevaluation of the patient after these medicines showed that the patient resolved I have reviewed the patients home medicines and have made adjustments as needed   Social Determinants of Health  Access to healthcare   Test / Admission - Considered   Discussed findings with patient. All questions were answered. She is  hemodynamically stable and safe for discharge home. Return precautions provided.       Final Clinical Impression(s) / ED Diagnoses Final diagnoses:  Acute non intractable tension-type headache    Rx / DC Orders ED Discharge Orders     None         Maxwell Marion, PA-C 05/11/23 1530    Wynetta Fines, MD 05/12/23 [phone]

## 2023-05-11 NOTE — Discharge Instructions (Signed)
You can take Ibuprofen and Tylenol as needed if your headache returns.  Follow up with your PCP in 5 days for reevaluation of your symptoms.  Get help right away if: You all of a sudden get a very bad headache with any of these things: A stiff neck. Feeling like you may vomit. Vomiting. Feeling mixed up (confused). Feeling weak in one part or one side of your body. Having trouble seeing or speaking, or both. Feeling short of breath. A rash. Feeling very sleepy. Pain in your eye or ear. Trouble walking or balancing. Feeling like you will faint, or you faint.

## 2023-05-11 NOTE — ED Triage Notes (Signed)
Patient reports headache and nausea starting this AM. States her BP was elevated this morning, but took her BP medication hoping it would help the headache. Denies vision changes, vomiting, cough, and congestion.

## 2023-05-18 ENCOUNTER — Ambulatory Visit
Admission: RE | Admit: 2023-05-18 | Discharge: 2023-05-18 | Disposition: A | Discharge status: Home / Self Care | Payer: BC Managed Care – PPO | Source: Ambulatory Visit | Attending: Primary Care | Admitting: Primary Care

## 2023-05-18 ENCOUNTER — Other Ambulatory Visit: Payer: Self-pay | Admitting: Primary Care

## 2023-05-18 DIAGNOSIS — R928 Other abnormal and inconclusive findings on diagnostic imaging of breast: Secondary | ICD-10-CM

## 2023-05-18 DIAGNOSIS — N6310 Unspecified lump in the right breast, unspecified quadrant: Secondary | ICD-10-CM

## 2023-05-20 ENCOUNTER — Encounter: Payer: Self-pay | Admitting: Primary Care

## 2023-05-30 ENCOUNTER — Other Ambulatory Visit (HOSPITAL_COMMUNITY): Payer: Self-pay

## 2023-05-30 MED ORDER — WEGOVY 0.25 MG/0.5ML ~~LOC~~ SOAJ
0.2500 mg | SUBCUTANEOUS | 0 refills | Status: AC
  Filled 2023-05-30: qty 2, 28d supply, fill #0

## 2023-05-31 ENCOUNTER — Other Ambulatory Visit (HOSPITAL_COMMUNITY): Payer: Self-pay

## 2023-06-06 ENCOUNTER — Ambulatory Visit (INDEPENDENT_AMBULATORY_CARE_PROVIDER_SITE_OTHER): Payer: BC Managed Care – PPO | Admitting: Primary Care

## 2023-06-06 DIAGNOSIS — I10 Essential (primary) hypertension: Secondary | ICD-10-CM

## 2023-06-06 DIAGNOSIS — E782 Mixed hyperlipidemia: Secondary | ICD-10-CM | POA: Diagnosis not present

## 2023-06-06 DIAGNOSIS — R7303 Prediabetes: Secondary | ICD-10-CM | POA: Diagnosis not present

## 2023-06-06 LAB — POCT GLYCOSYLATED HEMOGLOBIN (HGB A1C): HbA1c, POC (prediabetic range): 5.9 % (ref 5.7–6.4)

## 2023-06-06 NOTE — Progress Notes (Signed)
f/u HTN.

## 2023-06-06 NOTE — Progress Notes (Signed)
[facility]  Ashley Li, is a 42 y.o. female  PPI:[id number]  AYT:[id number]  DOB - 07/17/1980  Chief Complaint  Patient presents with   Prediabetes   Hypertension       Subjective:  Hypertension This is a chronic problem. The current episode started more than 1 year ago. The problem has been gradually improving since onset. The problem is controlled. Risk factors for coronary artery disease include diabetes mellitus, obesity and dyslipidemia. Past treatments include calcium channel blockers. The current treatment provides significant improvement. There are no compliance problems.     Ashley Li is a 42 y.o. female here today for a follow up visit. Patient has No headache, No chest pain, No abdominal pain - No Nausea, No new weakness tingling or numbness, No Cough - shortness of breath  No problems updated.  No Known Allergies  Past Medical History:  Diagnosis Date   Hypertension     Current Outpatient Medications on File Prior to Visit  Medication Sig Dispense Refill   amLODipine (NORVASC) 5 MG tablet Take 1 tablet (5 mg total) by mouth daily. 90 tablet 1   atorvastatin (LIPITOR) 20 MG tablet Take 1 tablet (20 mg total) by mouth daily. 90 tablet 1   etonogestrel (NEXPLANON) 68 MG IMPL implant 1 each by Subdermal route once.     levocetirizine (XYZAL ALLERGY 24HR) 5 MG tablet Take 1 tablet (5 mg total) by mouth every evening. 90 tablet 1   Semaglutide-Weight Management (WEGOVY) 0.25 MG/0.5ML SOAJ Inject 0.25 mg into the skin (fatty tissue) every 7 days for 4 weeks 2 mL 0   traZODone (DESYREL) 50 MG tablet TAKE 1 1/2 TABLET AT BED TIME AS NEEDED FOR INSOMNIA 90 tablet 2   No current facility-administered medications on file prior to visit.    Objective:  There were no vitals taken for this visit.    Comprehensive ROS Pertinent positive and negative noted in HPI   Exam General appearance : Awake, alert, not in any distress. Speech Clear. Not  toxic looking HEENT: Atraumatic and Normocephalic, pupils equally reactive to light and accomodation Neck: Supple, no JVD. No cervical lymphadenopathy.  Chest: Good air entry bilaterally, no added sounds  CVS: S1 S2 regular, no murmurs.  Abdomen: Bowel sounds present, Non tender and not distended with no gaurding, rigidity or rebound. Extremities: B/L Lower Ext shows no edema, both legs are warm to touch Neurology: Awake alert, and oriented X 3, CN II-XII intact, Non focal Skin: No Rash  Data Review Lab Results  Component Value Date   HGBA1C 5.9 06/06/2023   HGBA1C CANCELED 06/06/2023   HGBA1C 6.4 (H) 12/04/2022    Assessment & Plan   Ashley Li was seen today for prediabetes and hypertension.  Diagnoses and all orders for this visit:  Essential hypertension  Mixed hyperlipidemia -     Lipid panel -     CBC with Differential/Platelet  Prediabetes -     Hemoglobin A1c     Patient have been counseled extensively about nutrition and exercise. Other issues discussed during this visit include: low cholesterol diet, weight control and daily exercise, foot care, annual eye examinations at Ophthalmology, importance of adherence with medications and regular follow-up. We also discussed long term complications of uncontrolled diabetes and hypertension.   Return in about 3 months (around 09/06/2023).  The patient was given clear instructions to go to ER or return to medical center if symptoms don't improve, worsen or new problems develop. The patient verbalized understanding. The  patient was told to call to get lab results if they haven't heard anything in the next week.   This note has been created with Education officer, environmental. Any transcriptional errors are unintentional.   Ashley Sessions, NP 06/09/2023, 8:53 PM

## 2023-06-07 LAB — LIPID PANEL
Chol/HDL Ratio: 3.3 ratio (ref 0.0–4.4)
Cholesterol, Total: 162 mg/dL (ref 100–199)
HDL: 49 mg/dL (ref >39)
LDL Chol Calc (NIH): 93 mg/dL (ref 0–99)
Triglycerides: 112 mg/dL (ref 0–149)
VLDL Cholesterol Cal: 20 mg/dL (ref 5–40)

## 2023-06-07 LAB — HEMOGLOBIN A1C

## 2023-06-09 ENCOUNTER — Encounter (INDEPENDENT_AMBULATORY_CARE_PROVIDER_SITE_OTHER): Payer: Self-pay | Admitting: Primary Care

## 2023-06-12 ENCOUNTER — Other Ambulatory Visit (HOSPITAL_COMMUNITY): Payer: Self-pay

## 2023-06-12 MED ORDER — ONDANSETRON HCL 8 MG PO TABS
8.0000 mg | ORAL_TABLET | Freq: Three times a day (TID) | ORAL | 0 refills | Status: AC | PRN
  Filled 2023-06-12: qty 18, 21d supply, fill #0

## 2023-06-12 MED ORDER — WEGOVY 0.5 MG/0.5ML ~~LOC~~ SOAJ
0.5000 mg | SUBCUTANEOUS | 0 refills | Status: AC
  Filled 2023-06-12 – 2023-06-26 (×3): qty 2, 28d supply, fill #0

## 2023-06-25 ENCOUNTER — Other Ambulatory Visit (HOSPITAL_COMMUNITY): Payer: Self-pay

## 2023-06-25 ENCOUNTER — Other Ambulatory Visit: Payer: Self-pay

## 2023-06-26 ENCOUNTER — Other Ambulatory Visit: Payer: Self-pay

## 2023-06-26 ENCOUNTER — Other Ambulatory Visit (HOSPITAL_COMMUNITY): Payer: Self-pay

## 2023-07-02 ENCOUNTER — Other Ambulatory Visit (HOSPITAL_COMMUNITY): Payer: Self-pay

## 2023-07-05 ENCOUNTER — Other Ambulatory Visit (HOSPITAL_COMMUNITY): Payer: Self-pay

## 2023-07-08 ENCOUNTER — Other Ambulatory Visit (HOSPITAL_COMMUNITY): Payer: Self-pay

## 2023-11-18 ENCOUNTER — Inpatient Hospital Stay: Admission: RE | Admit: 2023-11-18 | Payer: BC Managed Care – PPO | Source: Ambulatory Visit
